# Patient Record
Sex: Male | Born: 1980 | Race: White | Hispanic: No | Marital: Married | Smoking: Former smoker
Health system: Southern US, Community
[De-identification: ages and names within clinical notes are randomized; demographics above are authoritative.]

## PROBLEM LIST (undated history)

## (undated) DIAGNOSIS — F1910 Other psychoactive substance abuse, uncomplicated: Secondary | ICD-10-CM

## (undated) DIAGNOSIS — M549 Dorsalgia, unspecified: Secondary | ICD-10-CM

## (undated) HISTORY — DX: Dorsalgia, unspecified: M54.9

## (undated) HISTORY — PX: TONSILECTOMY, ADENOIDECTOMY, BILATERAL MYRINGOTOMY AND TUBES: SHX2538

## (undated) HISTORY — PX: TONSILLECTOMY: SUR1361

---
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
## 2009-04-27 ENCOUNTER — Ambulatory Visit: Payer: Self-pay | Admitting: Interventional Radiology

## 2009-04-27 ENCOUNTER — Emergency Department (HOSPITAL_BASED_OUTPATIENT_CLINIC_OR_DEPARTMENT_OTHER): Admission: EM | Admit: 2009-04-27 | Discharge: 2009-04-27 | Payer: Self-pay | Admitting: Emergency Medicine

## 2016-03-24 ENCOUNTER — Emergency Department (HOSPITAL_BASED_OUTPATIENT_CLINIC_OR_DEPARTMENT_OTHER)
Admission: EM | Admit: 2016-03-24 | Discharge: 2016-03-25 | Disposition: A | Discharge status: Home / Self Care | Payer: Self-pay | Attending: Emergency Medicine | Admitting: Emergency Medicine

## 2016-03-24 ENCOUNTER — Emergency Department (HOSPITAL_BASED_OUTPATIENT_CLINIC_OR_DEPARTMENT_OTHER): Payer: Self-pay

## 2016-03-24 ENCOUNTER — Encounter (HOSPITAL_BASED_OUTPATIENT_CLINIC_OR_DEPARTMENT_OTHER): Payer: Self-pay | Admitting: *Deleted

## 2016-03-24 DIAGNOSIS — M549 Dorsalgia, unspecified: Secondary | ICD-10-CM | POA: Insufficient documentation

## 2016-03-24 DIAGNOSIS — W1809XA Striking against other object with subsequent fall, initial encounter: Secondary | ICD-10-CM | POA: Insufficient documentation

## 2016-03-24 DIAGNOSIS — R51 Headache: Secondary | ICD-10-CM | POA: Insufficient documentation

## 2016-03-24 DIAGNOSIS — S0285XA Fracture of orbit, unspecified, initial encounter for closed fracture: Secondary | ICD-10-CM

## 2016-03-24 DIAGNOSIS — Y939 Activity, unspecified: Secondary | ICD-10-CM | POA: Insufficient documentation

## 2016-03-24 DIAGNOSIS — S01112A Laceration without foreign body of left eyelid and periocular area, initial encounter: Secondary | ICD-10-CM | POA: Insufficient documentation

## 2016-03-24 DIAGNOSIS — S0232XA Fracture of orbital floor, left side, initial encounter for closed fracture: Secondary | ICD-10-CM | POA: Insufficient documentation

## 2016-03-24 DIAGNOSIS — F17210 Nicotine dependence, cigarettes, uncomplicated: Secondary | ICD-10-CM | POA: Insufficient documentation

## 2016-03-24 DIAGNOSIS — S022XXA Fracture of nasal bones, initial encounter for closed fracture: Secondary | ICD-10-CM | POA: Insufficient documentation

## 2016-03-24 DIAGNOSIS — Y999 Unspecified external cause status: Secondary | ICD-10-CM | POA: Insufficient documentation

## 2016-03-24 DIAGNOSIS — M25562 Pain in left knee: Secondary | ICD-10-CM | POA: Insufficient documentation

## 2016-03-24 DIAGNOSIS — Y929 Unspecified place or not applicable: Secondary | ICD-10-CM | POA: Insufficient documentation

## 2016-03-24 DIAGNOSIS — W19XXXA Unspecified fall, initial encounter: Secondary | ICD-10-CM

## 2016-03-24 MED ORDER — HYDROCODONE-ACETAMINOPHEN 5-325 MG PO TABS
1.0000 | ORAL_TABLET | Freq: Once | ORAL | Status: AC
  Administered 2016-03-24: 1 via ORAL
  Filled 2016-03-24: qty 1

## 2016-03-24 MED ORDER — FLUORESCEIN SODIUM 1 MG OP STRP
1.0000 | ORAL_STRIP | Freq: Once | OPHTHALMIC | Status: DC
  Filled 2016-03-24: qty 1

## 2016-03-24 MED ORDER — TETRACAINE HCL 0.5 % OP SOLN
2.0000 [drp] | Freq: Once | OPHTHALMIC | Status: DC
  Filled 2016-03-24: qty 4

## 2016-03-24 NOTE — ED Triage Notes (Signed)
Pt c/o " fall" left eye injury with bruising noted , left knee injury x 1 day ago

## 2016-03-24 NOTE — ED Notes (Signed)
Patient transported to CT 

## 2016-03-24 NOTE — ED Provider Notes (Signed)
[facility]-EMERGENCY DEPT Provider Note   CSN: [id number] Arrival date & time: 03/24/16  1906   By signing my name below, I, John Hood, attest that this documentation has been prepared under the direction and in the presence of  Arvilla MeresAshley Meyer, PA-C. Electronically Signed: Clovis PuAvnee Hood, ED Scribe. 03/24/16. 9:37 PM.   History   Chief Complaint Chief Complaint  Patient presents with  . Fall    The history is provided by the patient. No language interpreter was used.   HPI Comments:  John Hood is a 35 y.o. male who presents to the Emergency Department complaining of "throbbing" L eye pain and L knee pain s/p a fall which occured ~ 11 PM x 1 day. Pt states he was intoxicated, fell, hit his eye on a door hinge and broke his fall on his L knee. He also notes he hit his head and back on various objects. Associated symptoms includes neck pain, thoracic back pain, headache, eye discharge, laceration to eyelid, photophobia, and "hazy,foggy" vision. He notes his pain is exacerbated with movement. Pt denies LOC and fevers. No alleviating factors noted. No loss of bowel or bladder function. No numbness. Pt is not followed by an optometrist. He denies any other symptoms or complaints at this time. Tetanus is UTD. Following imaging, patient says he went [facility] early in the morning; however, left before he was seen. No treatments tried PTA.   History reviewed. No pertinent past medical history.  There are no active problems to display for this patient.   Past Surgical History:  Procedure Laterality Date  . TONSILECTOMY, ADENOIDECTOMY, BILATERAL MYRINGOTOMY AND TUBES    . TONSILLECTOMY      Home Medications    Prior to Admission medications   Medication Sig Start Date End Date Taking? Authorizing Provider  cephALEXin (KEFLEX) 500 MG capsule Take 1 capsule (500 mg total) by mouth 2 (two) times daily. 03/25/16 04/01/16  Lona KettleAshley Laurel Meyer, PA-C  erythromycin ophthalmic ointment Place  a 1/2 inch ribbon of ointment into the lower eyelid four times daily for 5-7 days 03/25/16   Lona KettleAshley Laurel Meyer, PA-C  HYDROcodone-acetaminophen (NORCO/VICODIN) 5-325 MG tablet Take 1 tablet by mouth every 6 (six) hours as needed for severe pain. 03/25/16   Lona KettleAshley Laurel Meyer, PA-C    Family History History reviewed. No pertinent family history.  Social History Social History  Substance Use Topics  . Smoking status: Current Every Day Smoker    Packs/day: 0.50    Types: Cigarettes  . Smokeless tobacco: Not on file  . Alcohol use No     Allergies   Aspirin   Review of Systems Review of Systems  Constitutional: Negative for fever.  HENT: Negative for trouble swallowing.   Eyes: Positive for photophobia, pain, discharge and visual disturbance.  Respiratory: Negative for shortness of breath.   Cardiovascular: Negative for chest pain.  Gastrointestinal: Negative for anal bleeding and vomiting.  Musculoskeletal: Positive for arthralgias and back pain.  Skin: Positive for wound.  Allergic/Immunologic: Negative for immunocompromised state.  Neurological: Positive for headaches. Negative for syncope and numbness.     Physical Exam Updated Vital Signs BP 121/80 (BP Location: Left Arm)   Pulse 61   Temp 98.1 F (36.7 C) (Oral)   Resp 18   Ht 5\' 7"  (1.702 m)   Wt 150 lb (68 kg)   SpO2 100%   BMI 23.49 kg/m   Physical Exam  Constitutional: He appears well-developed and well-nourished. No distress.  HENT:  Head: Normocephalic.    Right Ear: Tympanic membrane, external ear and ear canal normal. No hemotympanum.  Left Ear: Tympanic membrane, external ear and ear canal normal. No hemotympanum.  Nose: Nose normal. No nasal septal hematoma.  Mouth/Throat: Uvula is midline, oropharynx is clear and moist and mucous membranes are normal. No trismus in the jaw. No oropharyngeal exudate. No tonsillar exudate.  No obvious deformity of scalp. TTP of left parietal region.   Eyes:  EOM are normal. Pupils are equal, round, and reactive to light. Right eye exhibits no discharge. Left eye exhibits no discharge. Left conjunctiva has a hemorrhage. No scleral icterus.  Slit lamp exam:      The left eye shows no corneal abrasion, no corneal flare, no hyphema, no hypopyon and no fluorescein uptake.  Visual acuity is 20/40 R, 20/200 L uncorrected. Left eye: Periorbital ecchymosis. Subconjunctival hemorrhage to temporal region.  No proptosis. Eye is soft. Eyelid laceration noted to lateral upper lid. Lids are soft. TTP of orbit. PERRL. IOP is 12. EOMs intact. No entrapment. On fluorescein stain - no corneal foreign bodies, abrasions, or ulcerations. No seidel sign. Anterior chamber negative for hyphema or hypopyon.   Neck: Normal range of motion and phonation normal. Neck supple. No neck rigidity. Normal range of motion present.  Cardiovascular: Normal rate, regular rhythm, normal heart sounds and intact distal pulses.   No murmur heard. Pulmonary/Chest: Effort normal and breath sounds normal. No stridor. No respiratory distress. He has no wheezes. He has no rales.  Abdominal: Soft. Bowel sounds are normal. He exhibits no distension. There is no tenderness. There is no rigidity, no rebound, no guarding and no CVA tenderness.  Musculoskeletal: Normal range of motion. He exhibits tenderness. He exhibits no deformity.       Left knee: Tenderness found.  Midline thoracic tenderness.  Left knee: No obvious swelling, discoloration, or warmth. Diffuse TTP. Limited ROM and strength assessment secondary to pain. Neurovascularly intact distal to knee.   Lymphadenopathy:    He has no cervical adenopathy.  Neurological: He is alert. He is not disoriented. Coordination normal. GCS eye subscore is 4. GCS verbal subscore is 5. GCS motor subscore is 6.  Mental Status:  Alert, thought content appropriate, able to give a coherent history. Speech fluent without evidence of aphasia. Able to follow 2 step  commands without difficulty.  Cranial Nerves:  II:  pupils equal, round, reactive to light III,IV, VI: ptosis not present, extra-ocular motions intact bilaterally  V,VII: smile symmetric, facial light touch sensation equal VIII: hearing grossly normal to voice  X: uvula elevates symmetrically  XI: bilateral shoulder shrug symmetric and strong XII: midline tongue extension without fassiculations Motor:  Normal tone. 5/5 in UE b/l, equal and strong grip strength. Limited strength assessment in LLE secondary to knee pain.  Sensory: light touch normal in all extremities. Cerebellar: normal finger-to-nose with bilateral upper extremities Gait: deferred secondary to knee pain CV: distal pulses palpable throughout    Skin: Skin is warm and dry. He is not diaphoretic.  Psychiatric: He has a normal mood and affect. His behavior is normal.  Nursing note and vitals reviewed.    ED Treatments / Results  DIAGNOSTIC STUDIES:  Oxygen Saturation is 100% on RA, normal by my interpretation.    COORDINATION OF CARE:  9:31 PM Discussed treatment plan with pt at bedside and pt agreed to plan.  Labs (all labs ordered are listed, but only abnormal results are displayed) Labs Reviewed - No data to display  EKG  EKG Interpretation None       Radiology No results found.  Procedures Procedures (including critical care time)  Medications Ordered in ED Medications  HYDROcodone-acetaminophen (NORCO/VICODIN) 5-325 MG per tablet 1 tablet (1 tablet Oral Given 03/24/16 2351)  HYDROcodone-acetaminophen (NORCO/VICODIN) 5-325 MG per tablet 1 tablet (1 tablet Oral Given 03/25/16 0003)  erythromycin ophthalmic ointment ( Left Eye Given 03/25/16 0020)  cephALEXin (KEFLEX) capsule 500 mg (500 mg Oral Given 03/25/16 0018)     Initial Impression / Assessment and Plan / ED Course  I have reviewed the triage vital signs and the nursing notes.  Pertinent labs & imaging results that were available  during my care of the patient were reviewed by me and considered in my medical decision making (see chart for details).  Clinical Course  Value Comment By Time  DG Knee Complete 4 Views Left No obvious fracture or dislocation. Lona KettleAshley Laurel Hood, [place] 10/29 2000   CT head and maxillofacial reviewed Lona Kettleshley Laurel Meyer, PA-C 10/29 2300  DG Thoracic Spine 2 View No obvious fracture or subluxation.  Lona Kettleshley Laurel Hood, [place]C 10/29 2300    Patient presents to ED with complaint of left eye pain and swelling, headache, thoracic back pain, and left knee pain s/p injury x 1 day. Patient is afebrile and non-toxic appearing in NAD. VSS.   1. Left eye pain/swelling and headache: Physical exam remarkable for L periorbital ecchymosis with laceration to lateral upper lid and temporal subconjunctival hemorrhage. PERRL. V/A 20/40 R, 20/200 L. No proptosis. Eyeball is soft. TTP. EOMs intact, no entrapment. No seidel sign. No evidence of corneal abrasion, foreign body, or ulceration. Normal IOP. No cells and flare on slit lamp. TTP of left parietal region, no obvious deformity appreciated. CT maxillofacial and head remarkable for left lamina papyracea fracture, left nasal bone fracture, and left orbital floor fracture; globes are intact without entrapment. No intracranial hemorrhage. Patient is s/p laceration >12 hours, therefore laceration not repaired. UTD on tetanus. Patient placed on ophthalmic ABX ointment and PO antibiotics to prevent infection. Strict instructions to follow up with ophthalmology and ENT for further management, contact information provided.   2. Left knee pain: No obvious deformity, swelling, discoloration, or warmth. Diffuse TTP. Limited ROM secondary to pain. Neurovascularly intact distally. X-ray negative for fracture or dislocation, small suprapatellar joint effusion. May be indicative of ligamentous/tendon injury. Discussed results and plan with patient. Patient placed in knee sleeve and  provided crutches. Conservative management discussed to include RICE and pain control. Follow up with ortho if sxs persist.  3. Thoracic back pain: Mild TTP of thoracic spine. No loss of bowel or bladder function. No fever. No h/o cancer. X-ray negative for fracture or subluxation. Suspect MSK. Discussed conservative management.   Discussed patient with Dr. Erma HeritageIsaacs, who also saw patient. Agrees with plan.   Patient to follow up with ophthalmology, ENT, and ortho for further management s/p fall. Rx vicodin for severe pain. Review of [place] controlled substance database shows no recent narcotic rx. Return precautions given. Patient voiced understanding and is agreeable.    Final Clinical Impressions(s) / ED Diagnoses   Final diagnoses:  Fall, initial encounter  Acute pain of left knee  Left eyelid laceration, initial encounter  Closed fracture of orbit, initial encounter [facility](HCC)  Closed fracture of nasal bone, initial encounter    New Prescriptions Discharge Medication List as of 03/25/2016 12:52 AM    START taking these medications   Details  cephALEXin (KEFLEX) 500 MG capsule  Take 1 capsule (500 mg total) by mouth 2 (two) times daily., Starting Mon 03/25/2016, Until Mon 04/01/2016, Print    erythromycin ophthalmic ointment Place a 1/2 inch ribbon of ointment into the lower eyelid four times daily for 5-7 days, Print    HYDROcodone-acetaminophen (NORCO/VICODIN) 5-325 MG tablet Take 1 tablet by mouth every 6 (six) hours as needed for severe pain., Starting Mon 03/25/2016, Print      I personally performed the services described in this documentation, which was scribed in my presence. The recorded information has been reviewed and is accurate.     Lona Kettle, PA-C 03/27/16 1925    Shaune Pollack, MD 03/27/16 [phone]

## 2016-03-25 MED ORDER — HYDROCODONE-ACETAMINOPHEN 5-325 MG PO TABS: 1.0000 | ORAL_TABLET | Freq: Four times a day (QID) | ORAL | 0 refills | Status: DC | PRN

## 2016-03-25 MED ORDER — ERYTHROMYCIN 5 MG/GM OP OINT
TOPICAL_OINTMENT | Freq: Once | OPHTHALMIC | Status: AC
  Administered 2016-03-25: via OPHTHALMIC
  Filled 2016-03-25: qty 3.5

## 2016-03-25 MED ORDER — CEPHALEXIN 500 MG PO CAPS: 500.0000 mg | ORAL_CAPSULE | Freq: Two times a day (BID) | ORAL | 0 refills | Status: AC

## 2016-03-25 MED ORDER — ERYTHROMYCIN 5 MG/GM OP OINT: TOPICAL_OINTMENT | OPHTHALMIC | 0 refills | Status: DC

## 2016-03-25 MED ORDER — CEPHALEXIN 250 MG PO CAPS
500.0000 mg | ORAL_CAPSULE | Freq: Once | ORAL | Status: AC
  Administered 2016-03-25: 500 mg via ORAL
  Filled 2016-03-25: qty 2

## 2016-03-25 MED ORDER — HYDROCODONE-ACETAMINOPHEN 5-325 MG PO TABS
1.0000 | ORAL_TABLET | Freq: Once | ORAL | Status: AC
  Administered 2016-03-25: 1 via ORAL
  Filled 2016-03-25: qty 1

## 2016-03-25 NOTE — ED Notes (Signed)
Patient is in good condition, discharge instructions reviewed, prescription medication reviewed, follow up care reviewed; patient verbalized understanding; patient is ambulatory, and left with friends.

## 2016-03-25 NOTE — Discharge Instructions (Signed)
Read the information below.  You have a nasal fracture and orbital floor fracture. Your eye is intact. Your pressures are normal.  You are being prescribed antibiotic ointment. Apply a thin strip in bottom of eye four times a day for 5-7 days. You are also being placed on oral antibiotics to prevent infection.  It is very important that you follow up with the eye doctor in the next 1-2 days for re-evaluation. I have provided the contact information for the eye doctor. Please call in the morning.  It is also important that you follow up with the ear, nose, and throat doctor regarding the facial fractures. I have provided the contact information above. Please call in the morning.  Your knee x-ray shows a small joint effusion at the top of your knee. No obvious fractures or dislocations. Ice and elevate for 20 minute increments for the next 2-3 days. I have provided the contact information for orthopedics if pain persists.  You can take motrin for mild to moderate pain.  I have prescribed vicodin for severe pain.  Use the prescribed medication as directed.  Please discuss all new medications with your pharmacist.   You may return to the Emergency Department at any time for worsening condition or any new symptoms that concern you.

## 2017-05-08 IMAGING — CR DG THORACIC SPINE 2V
3 series · 3 of 3 positions shown · non-contrast
Comparison: Thoracic spine radiographs performed earlier today at
[DATE] p.m.

CLINICAL DATA: Status post fall, with mid back pain. Initial
encounter.

EXAM:
THORACIC SPINE 2 VIEWS

[t t-spine a.p.]
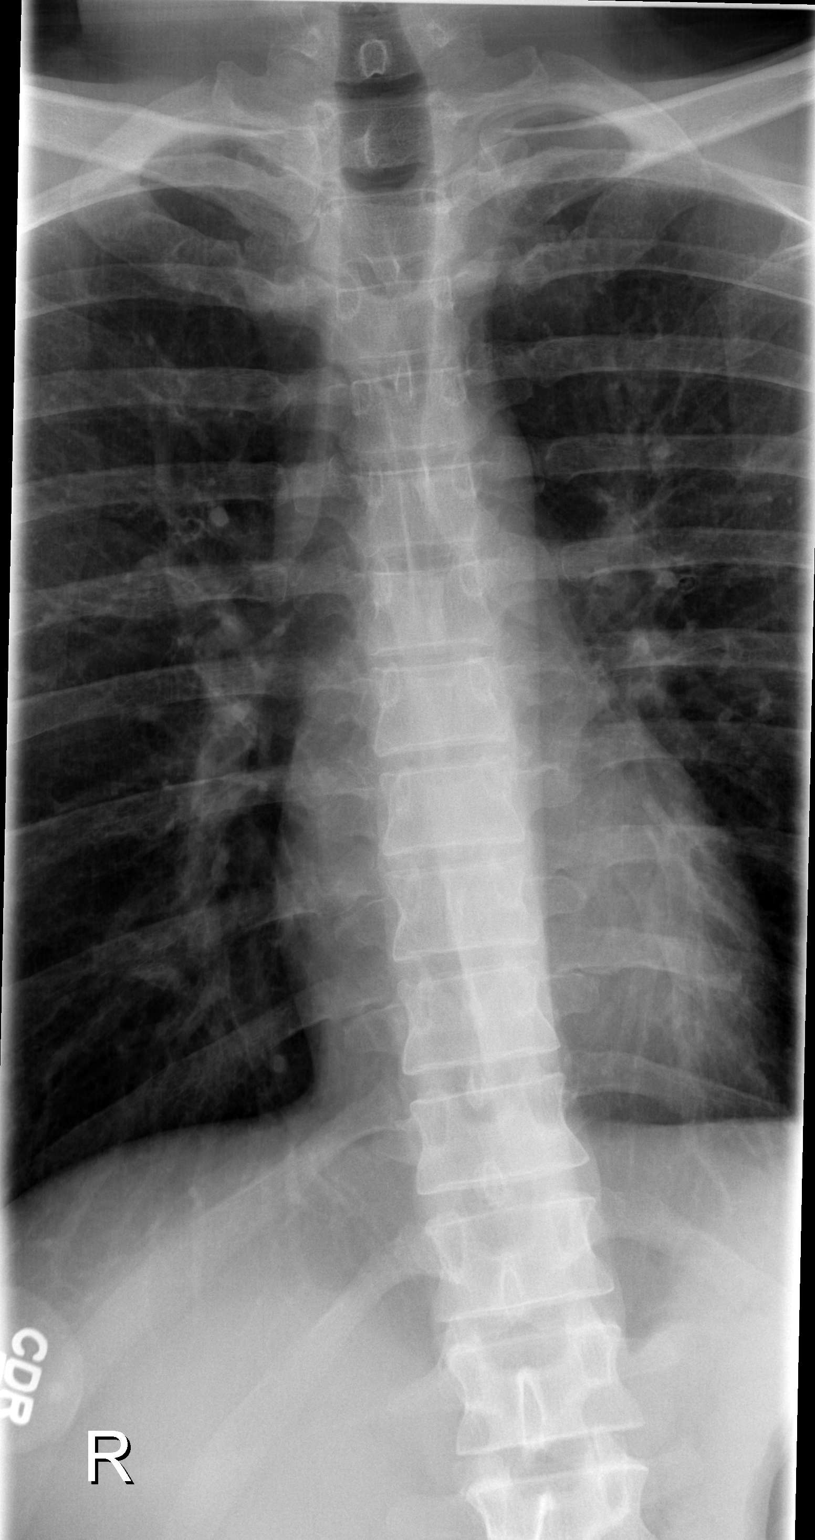

[t t-spine lat *]
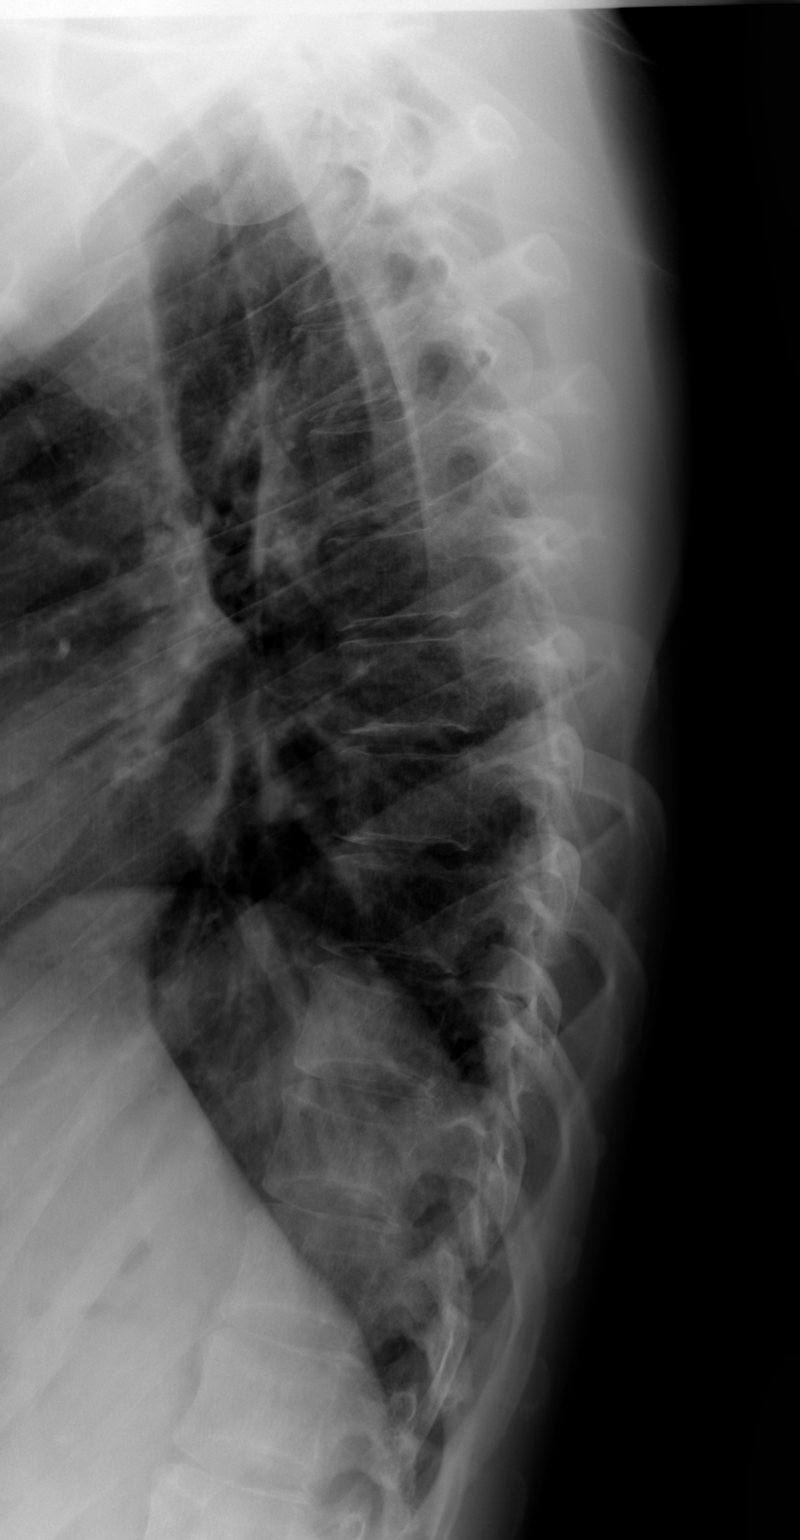

[t swimmers]
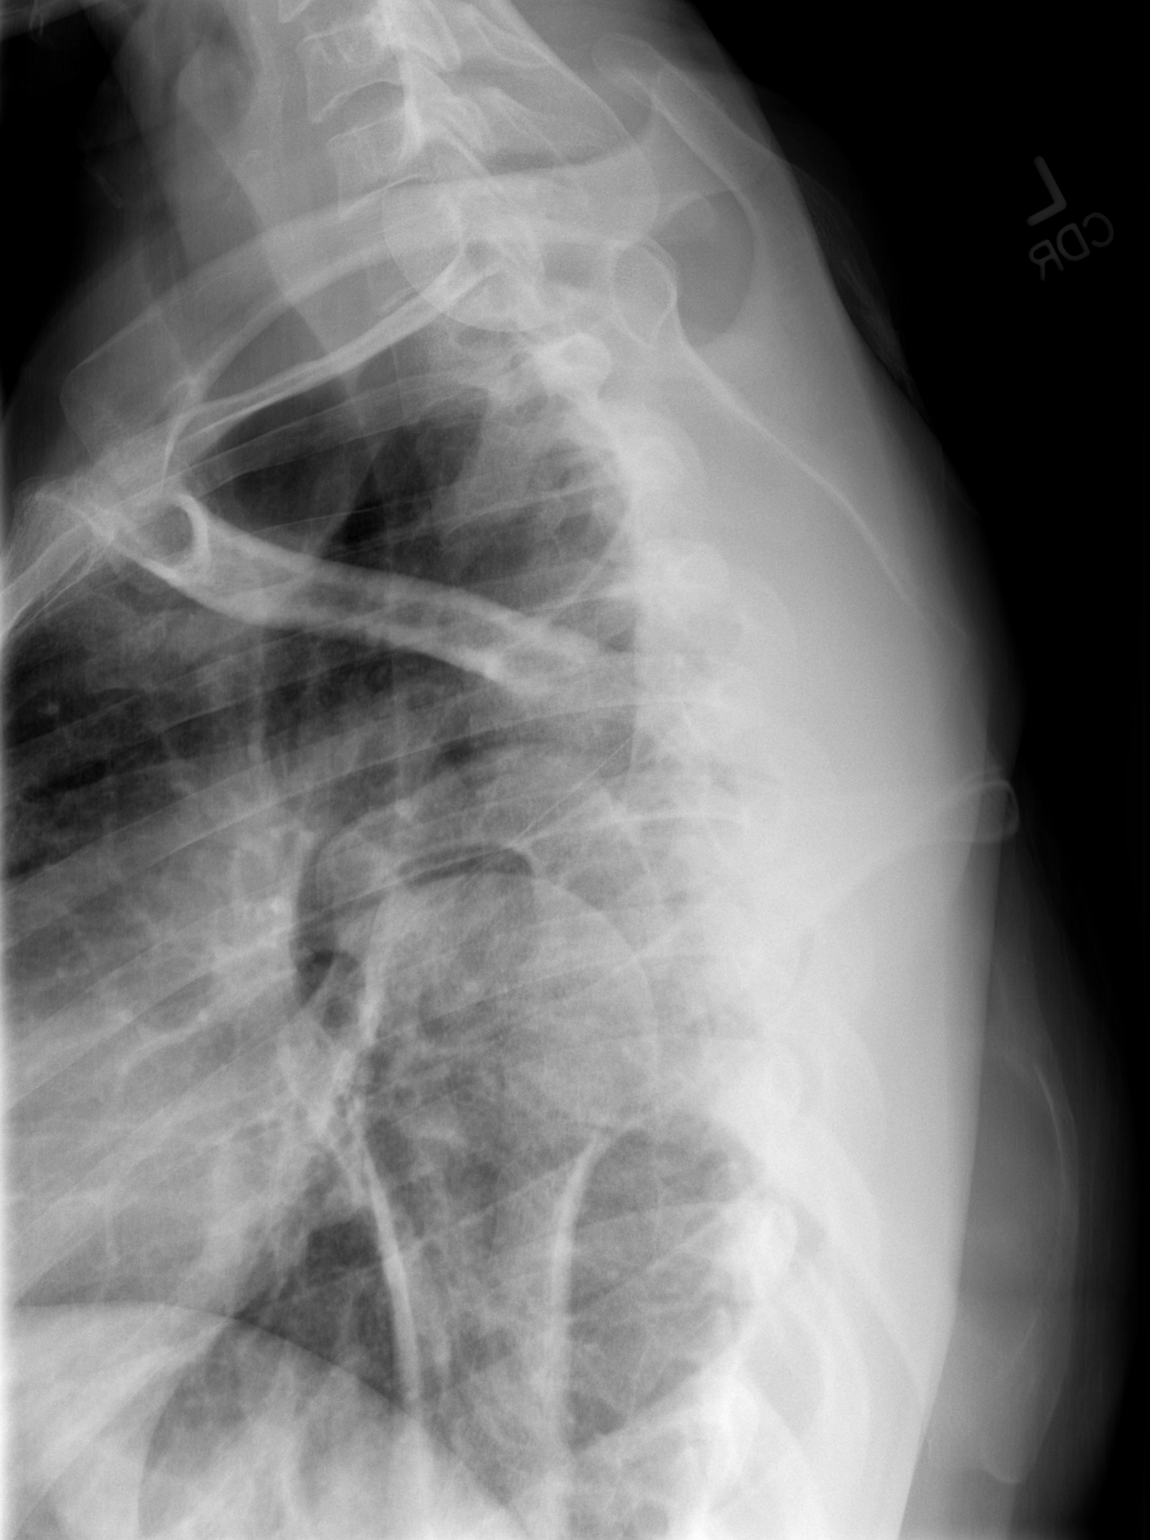

[3 of 3 positions shown; findings below may reference images not displayed]

FINDINGS: There is no evidence of fracture or subluxation. Vertebral bodies
demonstrate normal height and alignment. Intervertebral disc spaces
are preserved.

The visualized portions of both lungs are clear. The mediastinum is
unremarkable in appearance.
IMPRESSION: No evidence of fracture or subluxation along the thoracic spine.

## 2017-05-08 IMAGING — CT CT HEAD W/O CM
3 of 6 series · 16 of 47 positions shown, 19 images · non-contrast
Comparison: 03/24/2016 at [DATE]

CLINICAL DATA: Direct trauma to the left orbit after falling last
night

EXAM:
CT HEAD WITHOUT CONTRAST
CT MAXILLOFACIAL WITHOUT CONTRAST
TECHNIQUE: Multidetector CT imaging of the head and maxillofacial structures
were performed using the standard protocol without intravenous
contrast. Multiplanar CT image reconstructions of the maxillofacial
structures were also generated.

[Series 6: max soft · axial · 0.36mm/px · z∈[+1051,+1213]mm · 11 of 91 slices shown, 14 images]
[im 5/91  brain]
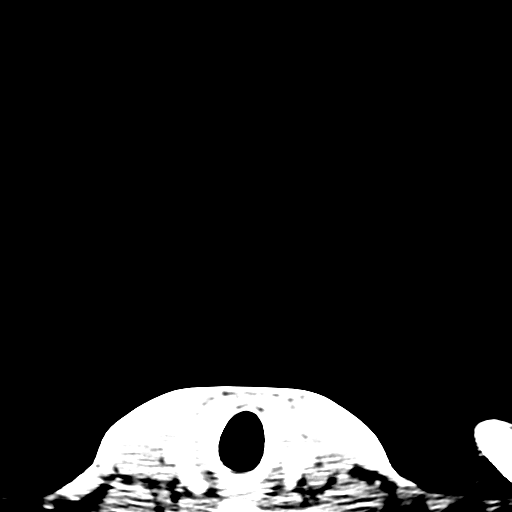
[im 5/91  bone]
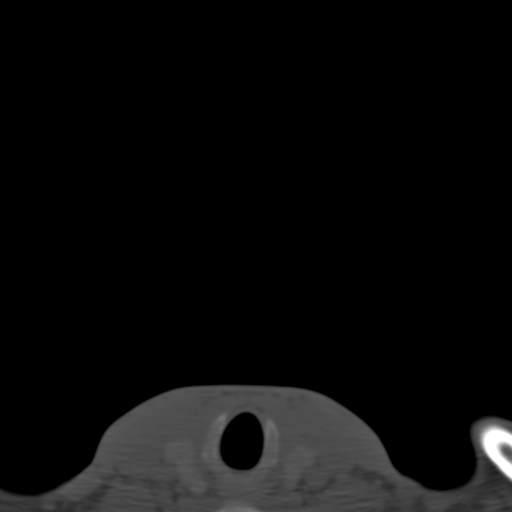
[im 13/91  brain]
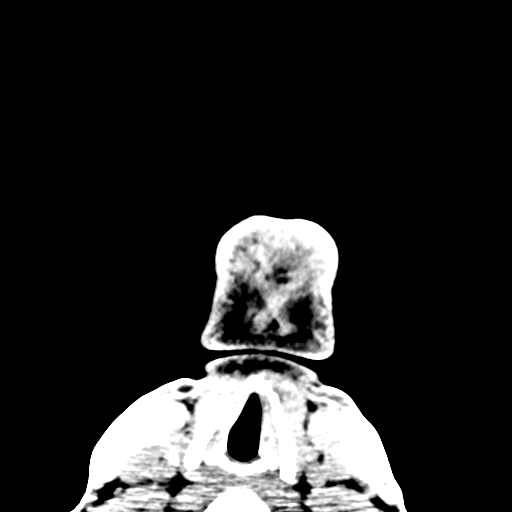
[im 21/91  brain]
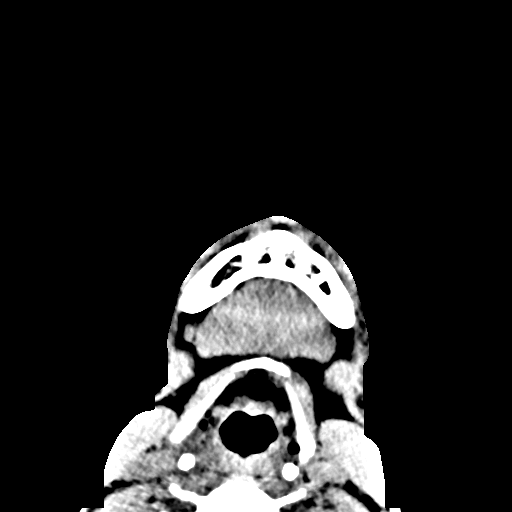
[im 29/91  brain]
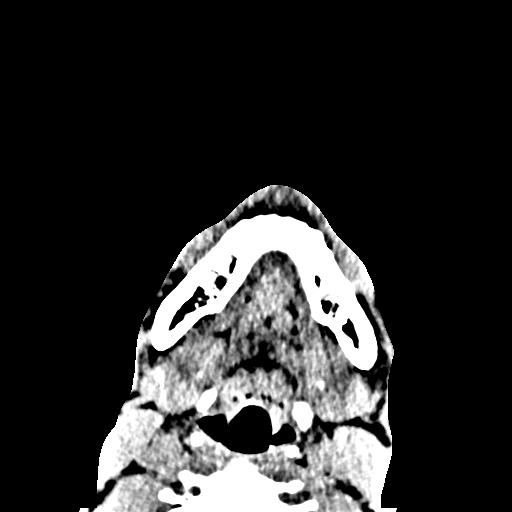
[im 37/91  brain]
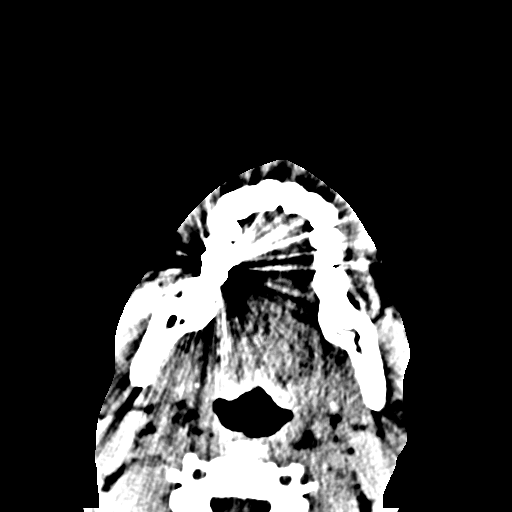
[im 37/91  bone]
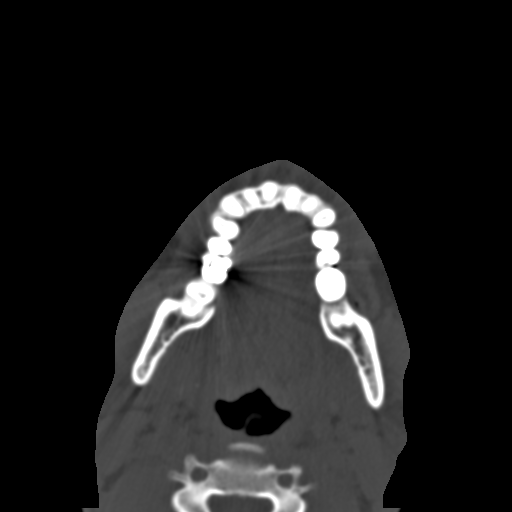
[im 46/91  brain]
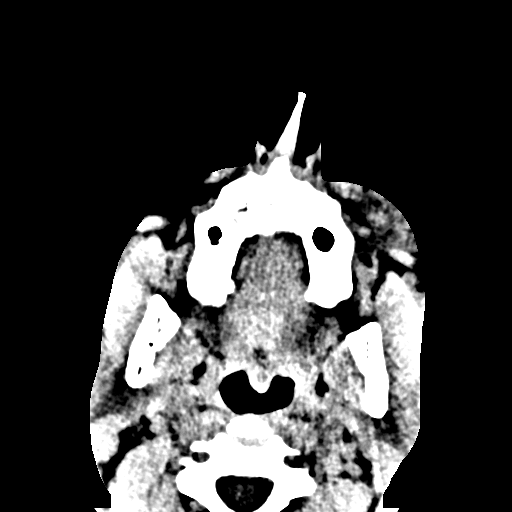
[im 54/91  brain]
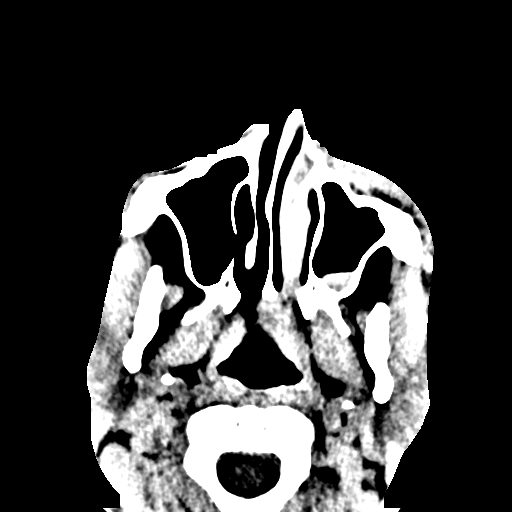
[im 62/91  brain]
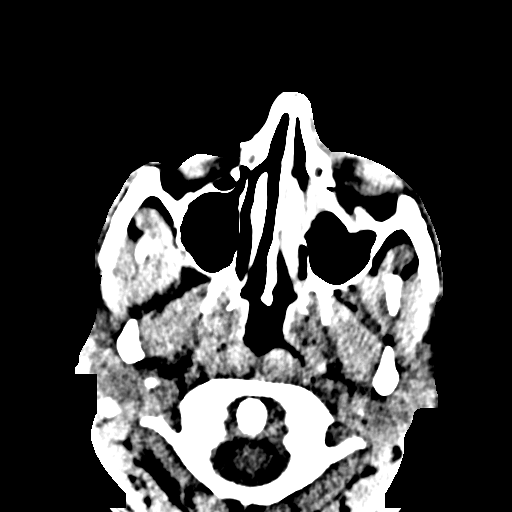
[im 70/91  brain]
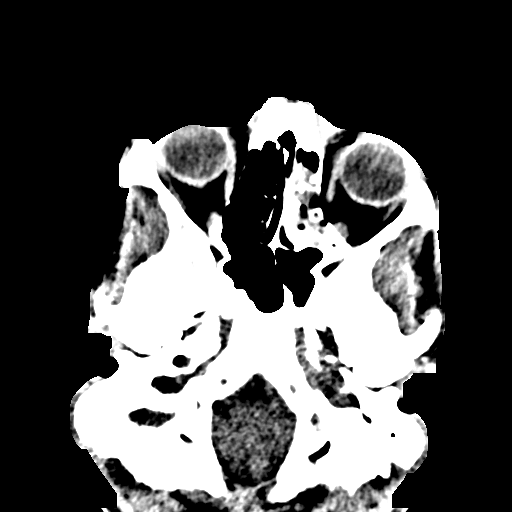
[im 70/91  bone]
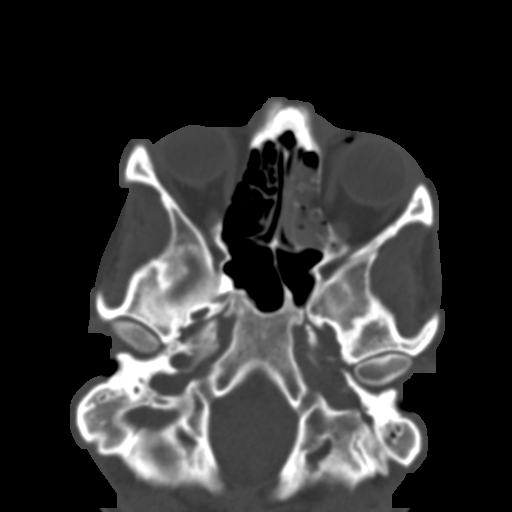
[im 78/91  brain]
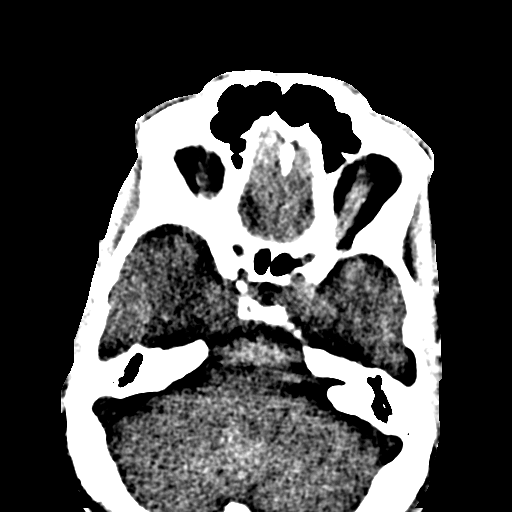
[im 86/91  brain]
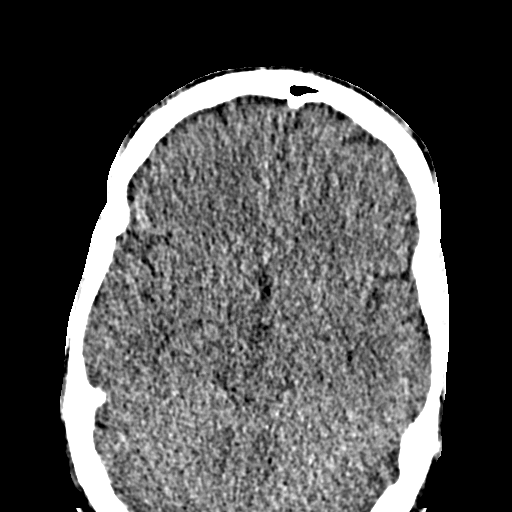

[Series 8: coronal soft · coronal · 0.33mm/px · 3 of 76 slices shown]
[im 22/76  brain]
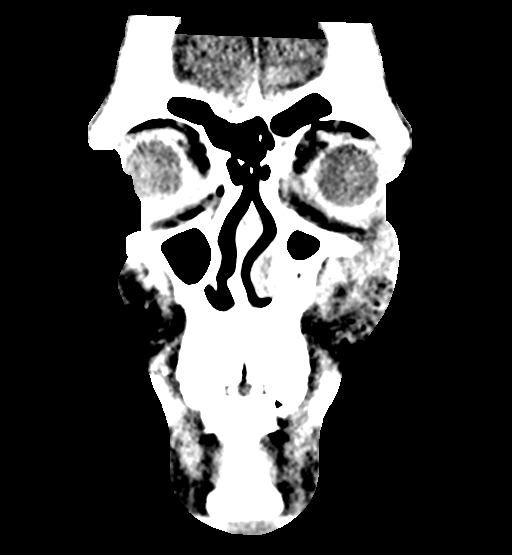
[im 40/76  brain]
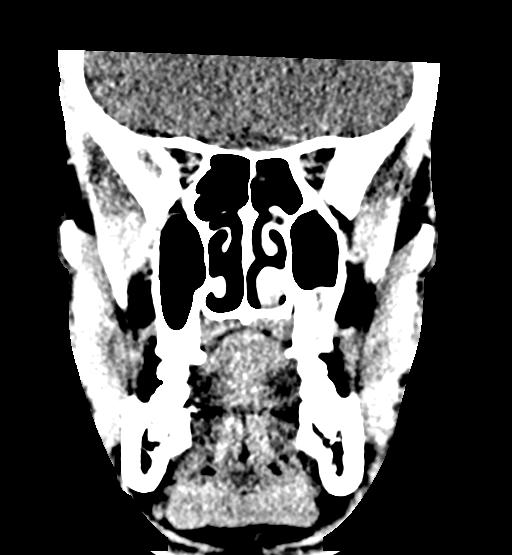
[im 58/76  brain]
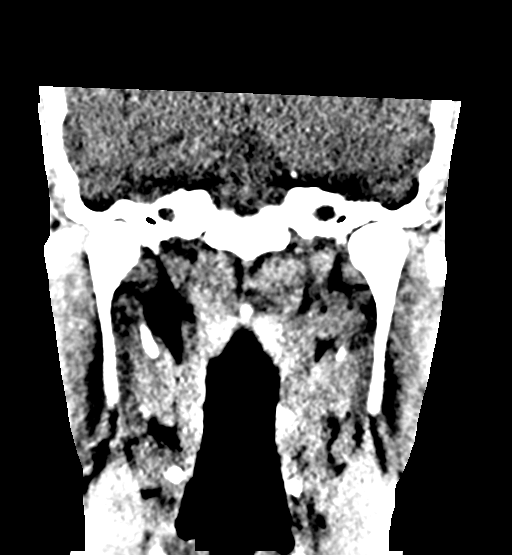

[Series 9: sagittal soft · sagittal · 0.33mm/px · 2 of 81 slices shown]
[im 27/81  brain]
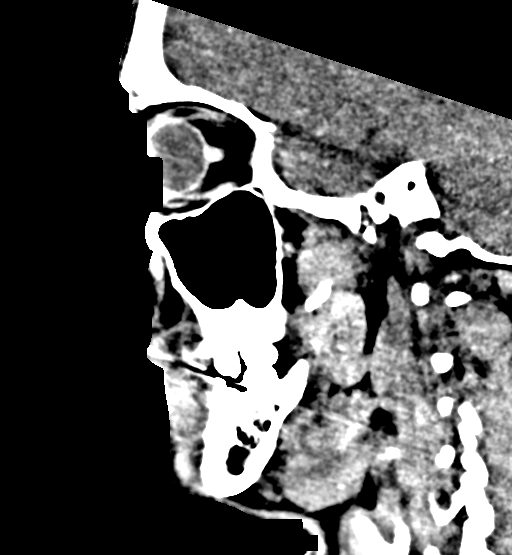
[im 54/81  brain]
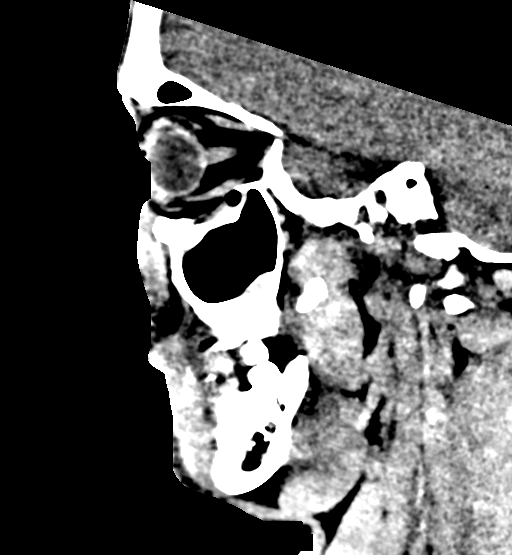

[16 of 47 positions shown; findings below may reference images not displayed]

FINDINGS: CT HEAD FINDINGS

Brain: No evidence of acute infarction, hemorrhage, hydrocephalus,
extra-axial collection or mass lesion/mass effect.

Vascular: No hyperdense vessel or unexpected calcification.

Skull: The calvarium and skullbase are intact.

CT MAXILLOFACIAL FINDINGS

Osseous: There is an acute depressed left lamina papyracea fracture,
unchanged. There is a mildly depressed left orbital floor fracture,
unchanged. There is a mildly depressed left nasal bone fracture,
unchanged.

Orbits: Mild left palpebral emphysema. Globes are intact. No
extraocular muscle entrapment.

Sinuses: Small air-fluid level in the left maxillary sinus.

Soft tissues: Periorbital swelling on the left.
IMPRESSION: 1. Normal brain
2. Acute mildly depressed fractures of the left lamina papyracea and
left orbital floor, unchanged. Acute mildly depressed left nasal
bone fracture.

## 2017-11-03 ENCOUNTER — Encounter (HOSPITAL_COMMUNITY): Payer: Self-pay

## 2017-11-03 ENCOUNTER — Emergency Department (HOSPITAL_COMMUNITY)
Admission: EM | Admit: 2017-11-03 | Discharge: 2017-11-03 | Disposition: A | Discharge status: Home / Self Care | Payer: Self-pay | Attending: Emergency Medicine | Admitting: Emergency Medicine

## 2017-11-03 ENCOUNTER — Other Ambulatory Visit: Payer: Self-pay

## 2017-11-03 DIAGNOSIS — F1910 Other psychoactive substance abuse, uncomplicated: Secondary | ICD-10-CM | POA: Insufficient documentation

## 2017-11-03 DIAGNOSIS — Z5321 Procedure and treatment not carried out due to patient leaving prior to being seen by health care provider: Secondary | ICD-10-CM | POA: Insufficient documentation

## 2017-11-03 HISTORY — DX: Other psychoactive substance abuse, uncomplicated: F19.10

## 2017-11-03 NOTE — ED Notes (Signed)
Pt decided to leave without being seen by provider. Pt states he was upset we were not going to admit him. Pt reports "there's no point in being seen if I'm not going to be staying overnight."

## 2017-11-03 NOTE — ED Notes (Signed)
Bed: [facility] Expected date:  Expected time:  Means of arrival:  Comments: 

## 2017-11-03 NOTE — ED Notes (Signed)
Bed: [facility] Expected date:  Expected time:  Means of arrival:  Comments: 

## 2017-11-03 NOTE — ED Triage Notes (Signed)
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 Patient states he last used heroin and xanax at 0300 today. Patient states he last drank alcohol yesterrday.Patient denies SI/HI.

## 2021-04-27 ENCOUNTER — Other Ambulatory Visit (HOSPITAL_BASED_OUTPATIENT_CLINIC_OR_DEPARTMENT_OTHER): Payer: Self-pay

## 2021-04-27 MED ORDER — GABAPENTIN 300 MG PO CAPS
ORAL_CAPSULE | ORAL | 0 refills | Status: DC
  Filled 2021-04-27: qty 21, 8d supply, fill #0

## 2021-04-27 MED ORDER — BUPRENORPHINE HCL-NALOXONE HCL 2-0.5 MG SL FILM
ORAL_FILM | SUBLINGUAL | 0 refills | Status: DC
  Filled 2021-04-27: qty 37, 7d supply, fill #0

## 2021-04-27 MED ORDER — BUPRENORPHINE HCL-NALOXONE HCL 8-2 MG SL FILM
ORAL_FILM | SUBLINGUAL | 0 refills | Status: DC
  Filled 2021-04-27: qty 14, 7d supply, fill #0

## 2021-04-30 ENCOUNTER — Other Ambulatory Visit (HOSPITAL_BASED_OUTPATIENT_CLINIC_OR_DEPARTMENT_OTHER): Payer: Self-pay

## 2021-05-04 ENCOUNTER — Other Ambulatory Visit (HOSPITAL_BASED_OUTPATIENT_CLINIC_OR_DEPARTMENT_OTHER): Payer: Self-pay

## 2021-05-04 MED ORDER — GABAPENTIN 300 MG PO CAPS
ORAL_CAPSULE | ORAL | 0 refills | Status: DC
  Filled 2021-05-04: qty 28, 7d supply, fill #0

## 2021-05-04 MED ORDER — BUPRENORPHINE HCL-NALOXONE HCL 8-2 MG SL FILM
ORAL_FILM | SUBLINGUAL | 0 refills | Status: DC
  Filled 2021-05-04: qty 14, 7d supply, fill #0

## 2021-05-11 ENCOUNTER — Other Ambulatory Visit (HOSPITAL_BASED_OUTPATIENT_CLINIC_OR_DEPARTMENT_OTHER): Payer: Self-pay

## 2021-05-11 MED ORDER — BUPRENORPHINE HCL-NALOXONE HCL 8-2 MG SL FILM
ORAL_FILM | SUBLINGUAL | 0 refills | Status: DC
  Filled 2021-05-11: qty 21, 7d supply, fill #0

## 2021-05-11 MED ORDER — GABAPENTIN 300 MG PO CAPS
ORAL_CAPSULE | ORAL | 0 refills | Status: DC
  Filled 2021-05-11: qty 28, 7d supply, fill #0

## 2021-05-14 ENCOUNTER — Other Ambulatory Visit (HOSPITAL_BASED_OUTPATIENT_CLINIC_OR_DEPARTMENT_OTHER): Payer: Self-pay

## 2021-05-18 ENCOUNTER — Other Ambulatory Visit (HOSPITAL_BASED_OUTPATIENT_CLINIC_OR_DEPARTMENT_OTHER): Payer: Self-pay

## 2021-05-18 MED ORDER — GABAPENTIN 600 MG PO TABS
ORAL_TABLET | ORAL | 0 refills | Status: DC
  Filled 2021-05-18: qty 42, 14d supply, fill #0

## 2021-05-18 MED ORDER — BUPRENORPHINE HCL-NALOXONE HCL 8-2 MG SL FILM
ORAL_FILM | SUBLINGUAL | 0 refills | Status: DC
  Filled 2021-05-18: qty 42, 14d supply, fill #0

## 2021-05-23 ENCOUNTER — Other Ambulatory Visit (HOSPITAL_COMMUNITY): Payer: Self-pay

## 2021-05-23 ENCOUNTER — Telehealth: Payer: Self-pay

## 2021-05-23 NOTE — Telephone Encounter (Signed)
[facility] Patient Advocate Encounter ? ?Insurance verification completed.   ? ?The patient is uninsured and will need patient assistance for medication. ? ?We can complete the application and will need to meet with the patient for signatures and income documentation. ? ?Ciaran Begay, CPhT ?Specialty Pharmacy Patient Advocate ?[facility] ?Phone: [phone] ?Fax:  [phone]  ?

## 2021-05-24 ENCOUNTER — Encounter: Payer: Self-pay | Admitting: Infectious Diseases

## 2021-06-01 ENCOUNTER — Encounter: Payer: Self-pay | Admitting: Infectious Diseases

## 2021-07-09 ENCOUNTER — Other Ambulatory Visit (HOSPITAL_BASED_OUTPATIENT_CLINIC_OR_DEPARTMENT_OTHER): Payer: Self-pay

## 2021-07-09 MED ORDER — BUPRENORPHINE HCL-NALOXONE HCL 8-2 MG SL FILM
ORAL_FILM | SUBLINGUAL | 0 refills | Status: DC
  Filled 2021-07-09: qty 21, 7d supply, fill #0

## 2022-09-25 ENCOUNTER — Encounter (HOSPITAL_BASED_OUTPATIENT_CLINIC_OR_DEPARTMENT_OTHER): Payer: Self-pay

## 2022-09-25 ENCOUNTER — Other Ambulatory Visit: Payer: Self-pay

## 2022-09-25 ENCOUNTER — Emergency Department (HOSPITAL_BASED_OUTPATIENT_CLINIC_OR_DEPARTMENT_OTHER)
Admission: EM | Admit: 2022-09-25 | Discharge: 2022-09-25 | Disposition: A | Discharge status: Home / Self Care | Payer: Self-pay | Attending: Emergency Medicine | Admitting: Emergency Medicine

## 2022-09-25 DIAGNOSIS — K029 Dental caries, unspecified: Secondary | ICD-10-CM | POA: Insufficient documentation

## 2022-09-25 MED ORDER — AMOXICILLIN-POT CLAVULANATE 875-125 MG PO TABS: 1.0000 | ORAL_TABLET | Freq: Two times a day (BID) | ORAL | 0 refills | Status: DC

## 2022-09-25 NOTE — ED Provider Notes (Signed)
EMERGENCY DEPARTMENT AT [facility] Provider Note   CSN: [id number] Arrival date & time: 09/25/22  1056     History  Chief Complaint  Patient presents with   Dental Pain    John Hood is a 42 y.o. male who presents emergency department concerns for right upper dental pain.  Notes he has a tooth broken to the area.  Patient is currently in [facility].  No meds tried prior to arrival.  Patient does not currently have a dentist.  Denies fever, trouble breathing, trouble swallowing, drainage.  The history is provided by the patient. No language interpreter was used.       Home Medications Prior to Admission medications   Medication Sig Start Date End Date Taking? Authorizing Provider  amoxicillin-clavulanate (AUGMENTIN) 875-125 MG tablet Take 1 tablet by mouth every 12 (twelve) hours. 09/25/22  Yes Zavian Slowey A, PA-C  Buprenorphine HCl-Naloxone HCl 2-0.5 MG FILM See patient instructions 04/27/21   Kern Alberta, MD  Buprenorphine HCl-Naloxone HCl 8-2 MG FILM Dissolve 1 tablet by mouth 3 times a day 07/09/21     erythromycin ophthalmic ointment Place a 1/2 inch ribbon of ointment into the lower eyelid four times daily for 5-7 days 03/25/16   Deborha Payment, PA-C  gabapentin (NEURONTIN) 300 MG capsule Take 2 capsules by mouth in the morning, 1 capsule in the evening, and 1 capsule at bedtime 05/11/21   Kern Alberta, MD  gabapentin (NEURONTIN) 600 MG tablet Take 1 tablet by mouth 3 times daily 05/18/21   Kern Alberta, MD  HYDROcodone-acetaminophen (NORCO/VICODIN) 5-325 MG tablet Take 1 tablet by mouth every 6 (six) hours as needed for severe pain. 03/25/16   Deborha Payment, PA-C      Allergies    Aspirin    Review of Systems   Review of Systems  All other systems reviewed and are negative.   Physical Exam Updated Vital Signs BP 115/75 (BP Location: Left Arm)   Pulse (!) 59   Temp 98.4 F (36.9 C) (Oral)   Resp 18   Ht 5\' 7"  (1.702 m)   Wt 74.8  kg   SpO2 96%   BMI 25.84 kg/m  Physical Exam Vitals and nursing note reviewed.  Constitutional:      General: He is not in acute distress.    Appearance: He is not ill-appearing.  HENT:     Head: Normocephalic and atraumatic.     Right Ear: External ear normal.     Left Ear: External ear normal.     Nose: Nose normal.     Mouth/Throat:     Mouth: Mucous membranes are moist. Mucous membranes are not dry.     Dentition: Abnormal dentition. Dental tenderness and dental caries present.     Tongue: Tongue does not deviate from midline.     Pharynx: Oropharynx is clear. Uvula midline. No oropharyngeal exudate or posterior oropharyngeal erythema.     Tonsils: No tonsillar exudate or tonsillar abscesses.     Comments: Multiple dental caries noted throughout. Tenderness to palpation to right upper gum line. No fluctuance noted. No trismus. No retropharyngeal abscess. No peritonsillar abscess noted. Uvula midline. Eyes:     Extraocular Movements: Extraocular movements intact.  Cardiovascular:     Rate and Rhythm: Normal rate and regular rhythm.     Pulses: Normal pulses.     Heart sounds: Normal heart sounds.  Pulmonary:     Effort: Pulmonary effort is normal. No respiratory distress.  Breath sounds: Normal breath sounds.  Abdominal:     General: Bowel sounds are normal. There is no distension.     Palpations: Abdomen is soft. There is no mass.     Tenderness: There is no abdominal tenderness.  Musculoskeletal:        General: Normal range of motion.     Cervical back: Neck supple.  Lymphadenopathy:     Head:     Right side of head: No submental, submandibular, tonsillar, preauricular or posterior auricular adenopathy.     Left side of head: No submental, submandibular, tonsillar, preauricular or posterior auricular adenopathy.     Cervical: No cervical adenopathy.  Skin:    General: Skin is warm and dry.     Findings: No rash.  Neurological:     Mental Status: He is alert.   Psychiatric:        Behavior: Behavior normal.     ED Results / Procedures / Treatments   Labs (all labs ordered are listed, but only abnormal results are displayed) Labs Reviewed - No data to display  EKG None  Radiology No results found.  Procedures Procedures    Medications Ordered in ED Medications - No data to display  ED Course/ Medical Decision Making/ A&P                             Medical Decision Making Risk Prescription drug management.   Patient presents to the ED with right upper dental pain onset several weeks.  Patient does not have a dentist.  Vital signs pt afebrile.  On exam patient with Multiple dental caries noted throughout. Tenderness to palpation to right upper gum line. No fluctuance noted. No trismus. No retropharyngeal abscess. No peritonsillar abscess noted. Uvula midline. Differential diagnosis includes pharyngeal abscess, dental abscess, Ludwig's angina.    Disposition: Patient presentation suspicious for dentalgia.  No abscess requiring immediate incision and drainage.  Exam not concerning for Ludwig's angina or pharyngeal abscess. After consideration of the diagnostic results and the patients response to treatment, I feel that the patient would benefit from Discharge home. Will treat with Augmentin prescription.  Provided resource guide for dentists in the area, patient structured to follow-up with a dentist. Supportive care measures and strict return precautions discussed with patient at bedside. Pt acknowledges and verbalizes understanding. Pt appears safe for discharge. Follow up as indicated in discharge paperwork.  This chart was dictated using voice recognition software, Dragon. Despite the best efforts of this provider to proofread and correct errors, errors may still occur which can change documentation meaning.   Final Clinical Impression(s) / ED Diagnoses Final diagnoses:  Pain due to dental caries    Rx / DC Orders ED  Discharge Orders          Ordered    amoxicillin-clavulanate (AUGMENTIN) 875-125 MG tablet  Every 12 hours        09/25/22 1136              Tyliah Schlereth A, PA-C 09/25/22 1453    Ernie Avena, MD 09/25/22 1946

## 2022-09-25 NOTE — Discharge Instructions (Addendum)
It was a pleasure taking care of you today!   You will be prescribed Augmentin, take as directed and ensure to complete the entire course of the antibiotic. You may take over the counter 600 mg Ibuprofen every 6 hours and alternate with 500 mg Tylenol every 6 hours as directed.  Attached is information for the on-call dentist, you may call and set up a follow-up appointment regarding today's ED visit.  Attached you will find a resource guide for dentists in the area, call and set up a follow up appointment. Return to the Emergency Department if you are experiencing increasing/worsening symptoms. 

## 2022-09-25 NOTE — ED Notes (Signed)
Pt treated in triage.

## 2022-09-25 NOTE — ED Triage Notes (Signed)
C/o right upper dental pain radiating to neck. States tooth is broken. Currently at [facility].

## 2022-10-14 ENCOUNTER — Ambulatory Visit: Payer: Self-pay | Admitting: Physician Assistant

## 2022-10-14 ENCOUNTER — Encounter: Payer: Self-pay | Admitting: Physician Assistant

## 2022-10-14 VITALS — BP 109/68 | HR 76 | Ht 66.0 in | Wt 174.0 lb

## 2022-10-14 DIAGNOSIS — F17210 Nicotine dependence, cigarettes, uncomplicated: Secondary | ICD-10-CM

## 2022-10-14 DIAGNOSIS — K0889 Other specified disorders of teeth and supporting structures: Secondary | ICD-10-CM

## 2022-10-14 DIAGNOSIS — F1110 Opioid abuse, uncomplicated: Secondary | ICD-10-CM

## 2022-10-14 DIAGNOSIS — Z720 Tobacco use: Secondary | ICD-10-CM

## 2022-10-14 DIAGNOSIS — M5441 Lumbago with sciatica, right side: Secondary | ICD-10-CM

## 2022-10-14 DIAGNOSIS — G8929 Other chronic pain: Secondary | ICD-10-CM | POA: Insufficient documentation

## 2022-10-14 DIAGNOSIS — F1210 Cannabis abuse, uncomplicated: Secondary | ICD-10-CM

## 2022-10-14 MED ORDER — IBUPROFEN 800 MG PO TABS
800.0000 mg | ORAL_TABLET | Freq: Three times a day (TID) | ORAL | 0 refills | Status: DC | PRN
Start: 2022-10-14 — End: 2023-06-16

## 2022-10-14 MED ORDER — GABAPENTIN 400 MG PO CAPS: 400.0000 mg | ORAL_CAPSULE | Freq: Three times a day (TID) | ORAL | 0 refills | Status: DC

## 2022-10-14 MED ORDER — PENICILLIN V POTASSIUM 500 MG PO TABS
500.0000 mg | ORAL_TABLET | Freq: Three times a day (TID) | ORAL | 0 refills | Status: AC
Start: 2022-10-14 — End: 2022-10-24

## 2022-10-14 NOTE — Patient Instructions (Signed)
To help with your dental pain, you are going to take penicillin 3 times a day for the next 10 days.  You will increase ibuprofen 800 mg every 8 hours as needed.  I strongly encourage you to follow-up with dentistry as soon as you are able.  For your chronic sciatica, I encourage you to stay well-hydrated, do gentle stretching.  You are going to increase gabapentin 400 mg 3 times a day.  We will call you with today's lab results, please let us know if there is anything else we can do for you  Roney Jaffe, PA-C Physician Assistant [facility] [email or web address]   Sciatica  Sciatica is pain, numbness, weakness, or tingling along the path of the sciatic nerve. The sciatic nerve starts in the lower back and runs down the back of each leg. The nerve controls the muscles in the lower leg and in the back of the knee. It also provides feeling (sensation) to the back of the thigh, the lower leg, and the sole of the foot. Sciatica is a symptom of another medical condition that pinches or puts pressure on the sciatic nerve. Sciatica most often only affects one side of the body. Sciatica usually goes away on its own or with treatment. In some cases, sciatica may come back (recur). What are the causes? This condition is caused by pressure on the sciatic nerve or pinching of the nerve. This may be the result of: A disk in between the bones of the spine bulging out too far (herniated disk). Age-related changes in the spinal disks. A pain disorder that affects a muscle in the buttock. Extra bone growth near the sciatic nerve. A break (fracture) of the pelvis. Pregnancy. Tumor. This is rare. What increases the risk? The following factors may make you more likely to develop this condition: Playing sports that place pressure or stress on the spine. Having poor strength and flexibility. A history of back injury or surgery. Sitting for long periods  of time. Doing activities that involve repetitive bending or lifting. Obesity. What are the signs or symptoms? Symptoms can vary from mild to very severe. They may include: Any of the following problems in the lower back, leg, hip, or buttock: Mild tingling, numbness, or dull aches. Burning sensations. Sharp pains. Numbness in the back of the calf or the sole of the foot. Leg weakness. Severe back pain that makes movement difficult. Symptoms may get worse when you cough, sneeze, or laugh, or when you sit or stand for long periods of time. How is this diagnosed? This condition may be diagnosed based on: Your symptoms and medical history. A physical exam. Blood tests. Imaging tests, such as: X-rays. An MRI. A CT scan. How is this treated? In many cases, this condition improves on its own without treatment. However, treatment may include: Reducing or modifying physical activity. Exercising, including strengthening and stretching. Icing and applying heat to the affected area. Medicines that help to: Relieve pain and swelling. Relax your muscles. Injections of medicines that help to relieve pain and inflammation (steroids) around the sciatic nerve. Surgery. Follow these instructions at home: Medicines Take over-the-counter and prescription medicines only as told by your health care provider. Ask your health care provider if the medicine prescribed to you requires you to avoid driving or using heavy machinery. Managing pain     If directed, put ice on the affected area. To do this: Put ice in a plastic bag. Place a towel between your skin and  the bag. Leave the ice on for 20 minutes, 2-3 times a day. If your skin turns bright red, remove the ice right away to prevent skin damage. The risk of skin damage is higher if you cannot feel pain, heat, or cold. If directed, apply heat to the affected area as often as told by your health care provider. Use the heat source that your  health care provider recommends, such as a moist heat pack or a heating pad. Place a towel between your skin and the heat source. Leave the heat on for 20-30 minutes. If your skin turns bright red, remove the heat right away to prevent burns. The risk of burns is higher if you cannot feel pain, heat, or cold. Activity  Return to your normal activities as told by your health care provider. Ask your health care provider what activities are safe for you. Avoid activities that make your symptoms worse. Take brief periods of rest throughout the day. When you rest for longer periods, mix in some mild activity or stretching between periods of rest. This will help to prevent stiffness and pain. Avoid sitting for long periods of time without moving. Get up and move around at least one time each hour. Exercise and stretch regularly as told by your health care provider. Do not lift anything that is heavier than 10 lb (4.5 kg) until your health care provider says that it is safe. When you do not have symptoms, you should still avoid heavy lifting, especially repetitive heavy lifting. When you lift objects, always use proper lifting technique, which includes: Bending your knees. Keeping the load close to your body. Avoiding twisting. General instructions Maintain a healthy weight. Excess weight puts extra stress on your back. Wear supportive, comfortable shoes. Avoid wearing high heels. Avoid sleeping on a mattress that is too soft or too hard. A mattress that is firm enough to support your back when you sleep may help to reduce your pain. Contact a health care provider if: Your pain is not controlled by medicine. Your pain does not improve or gets worse. Your pain lasts longer than 4 weeks. You have unexplained weight loss. Get help right away if: You are not able to control when you urinate or have bowel movements (incontinence). You have: Weakness in your lower back, pelvis, buttocks, or legs that  gets worse. Redness or swelling of your back. A burning sensation when you urinate. Summary Sciatica is pain, numbness, weakness, or tingling along the path of the sciatic nerve, which may include the lower back, legs, hips, and buttocks. This condition is caused by pressure on the sciatic nerve or pinching of the nerve. Treatment often includes rest, exercise, medicines, and applying ice or heat. This information is not intended to replace advice given to you by your health care provider. Make sure you discuss any questions you have with your health care provider. Document Revised: 08/20/2021 Document Reviewed: 08/20/2021 Elsevier Patient Education  2023 [employer].

## 2022-10-14 NOTE — Progress Notes (Signed)
New Patient Office Visit  Subjective    Patient ID: John Hood, male    DOB: 1980-07-28  Age: 42 y.o. MRN: [id number]  CC:  Chief Complaint  Patient presents with   Dental Pain    Left upper side.    Medication Refill    Gab- request 600 mg     HPI John Hood states that he is currently being treated for substance abuse at [facility] residential treatment center.  States that he will be leaving tomorrow to go to an [employer] in [place].  States that he has been sober for 14 months and is very much looking forward to "starting his life".  States that he has been suffering from dental pain, states that he was seen in the emergency department on Sep 25, 2022 for the same complaint.  Note from that visit:   John Hood is a 42 y.o. male who presents emergency department concerns for right upper dental pain.  Notes he has a tooth broken to the area.  Patient is currently in [facility].  No meds tried prior to arrival.  Patient does not currently have a dentist.  Denies fever, trouble breathing, trouble swallowing, drainage.   Patient presents to the ED with right upper dental pain onset several weeks.  Patient does not have a dentist.  Vital signs pt afebrile.  On exam patient with Multiple dental caries noted throughout. Tenderness to palpation to right upper gum line. No fluctuance noted. No trismus. No retropharyngeal abscess. No peritonsillar abscess noted. Uvula midline. Differential diagnosis includes pharyngeal abscess, dental abscess, Ludwig's angina.      Disposition: Patient presentation suspicious for dentalgia.  No abscess requiring immediate incision and drainage.  Exam not concerning for Ludwig's angina or pharyngeal abscess. After consideration of the diagnostic results and the patients response to treatment, I feel that the patient would benefit from Discharge home. Will treat with Augmentin prescription.  Provided resource guide for dentists in the area, patient structured  to follow-up with a dentist. Supportive care measures and strict return precautions discussed with patient at bedside. Pt acknowledges and verbalizes understanding. Pt appears safe for discharge. Follow up as indicated in discharge paperwork.  States today that he did have mild relief while taking the Augmentin 7-day course, states once it stopped it started hurting again.  States that he is using 600 mg of ibuprofen every 8 hours with mild relief.  States that his Medicaid recently became active and will be scheduling an appointment with dentistry soon.  States that he suffers from chronic right-sided sciatica.  States that he believes this is from a football injury.  States that he has been taking 300 mg of gabapentin 3 times a day, states that he does not feel it is offering as much relief as it did previously.  Outpatient Encounter Medications as of 10/14/2022  Medication Sig   ibuprofen (ADVIL) 800 MG tablet Take 1 tablet (800 mg total) by mouth every 8 (eight) hours as needed.   penicillin v potassium (VEETID) 500 MG tablet Take 1 tablet (500 mg total) by mouth 3 (three) times daily for 10 days.   gabapentin (NEURONTIN) 400 MG capsule Take 1 capsule (400 mg total) by mouth 3 (three) times daily.   [DISCONTINUED] amoxicillin-clavulanate (AUGMENTIN) 875-125 MG tablet Take 1 tablet by mouth every 12 (twelve) hours.   [DISCONTINUED] Buprenorphine HCl-Naloxone HCl 2-0.5 MG FILM See patient instructions   [DISCONTINUED] Buprenorphine HCl-Naloxone HCl 8-2 MG FILM Dissolve 1 tablet by mouth  3 times a day   [DISCONTINUED] erythromycin ophthalmic ointment Place a 1/2 inch ribbon of ointment into the lower eyelid four times daily for 5-7 days   [DISCONTINUED] gabapentin (NEURONTIN) 300 MG capsule Take 2 capsules by mouth in the morning, 1 capsule in the evening, and 1 capsule at bedtime   [DISCONTINUED] gabapentin (NEURONTIN) 600 MG tablet Take 1 tablet by mouth 3 times daily   [DISCONTINUED]  HYDROcodone-acetaminophen (NORCO/VICODIN) 5-325 MG tablet Take 1 tablet by mouth every 6 (six) hours as needed for severe pain.   No facility-administered encounter medications on file as of 10/14/2022.    Past Medical History:  Diagnosis Date   Substance abuse [facility])     Past Surgical History:  Procedure Laterality Date   TONSILECTOMY, ADENOIDECTOMY, BILATERAL MYRINGOTOMY AND TUBES     TONSILLECTOMY      Family History  Problem Relation Age of Onset   Cancer Mother     Social History   Socioeconomic History   Marital status: Married    Spouse name: Not on file   Number of children: Not on file   Years of education: Not on file   Highest education level: Not on file  Occupational History   Not on file  Tobacco Use   Smoking status: Former    Packs/day: .5    Types: Cigarettes   Smokeless tobacco: Never  Vaping Use   Vaping Use: Never used  Substance and Sexual Activity   Alcohol use: Not Currently    Comment: daily   Drug use: Not Currently    Comment: xanax and heroin   Sexual activity: Not on file  Other Topics Concern   Not on file  Social History Narrative   Not on file   Social Determinants of Health   Financial Resource Strain: Not on file  Food Insecurity: Not on file  Transportation Needs: Not on file  Physical Activity: Not on file  Stress: Not on file  Social Connections: Not on file  Intimate Partner Violence: Not on file    Review of Systems  Constitutional:  Negative for chills and fever.  HENT: Negative.    Eyes: Negative.   Respiratory:  Negative for shortness of breath.   Cardiovascular:  Negative for chest pain.  Gastrointestinal: Negative.   Genitourinary: Negative.   Musculoskeletal:  Positive for back pain and joint pain.  Skin: Negative.   Neurological: Negative.   Endo/Heme/Allergies: Negative.   Psychiatric/Behavioral: Negative.          Objective    BP 109/68 (BP Location: Left Arm, Patient Position: Sitting, Cuff  Size: Large)   Pulse 76   Ht 5\' 6"  (1.676 m)   Wt 174 lb (78.9 kg)   SpO2 99%   BMI 28.08 kg/m   Physical Exam Vitals reviewed.  Constitutional:      Appearance: Normal appearance.  HENT:     Head: Normocephalic and atraumatic.     Right Ear: External ear normal.     Left Ear: External ear normal.     Nose: Nose normal.     Mouth/Throat:     Mouth: Mucous membranes are moist.     Dentition: Abnormal dentition. Dental tenderness, gingival swelling, dental caries and dental abscesses present.     Pharynx: Oropharynx is clear.     Comments: Right upper  Eyes:     Extraocular Movements: Extraocular movements intact.     Conjunctiva/sclera: Conjunctivae normal.     Pupils: Pupils are equal, round, and reactive  to light.  Cardiovascular:     Rate and Rhythm: Normal rate and regular rhythm.     Pulses: Normal pulses.          Dorsalis pedis pulses are 2+ on the right side.       Posterior tibial pulses are 2+ on the right side and 2+ on the left side.     Heart sounds: Normal heart sounds.  Pulmonary:     Effort: Pulmonary effort is normal.     Breath sounds: Normal breath sounds.  Musculoskeletal:     Cervical back: Normal, normal range of motion and neck supple.     Thoracic back: Normal.     Lumbar back: Tenderness present. Decreased range of motion.     Comments: Lower right tenderness, pain elicited with ROM testing  Skin:    General: Skin is warm and dry.  Neurological:     General: No focal deficit present.     Mental Status: He is alert.  Psychiatric:        Mood and Affect: Mood normal.        Behavior: Behavior normal.        Thought Content: Thought content normal.        Judgment: Judgment normal.        Assessment & Plan:   Problem List Items Addressed This Visit       Nervous and Auditory   Chronic right-sided low back pain with right-sided sciatica   Relevant Medications   ibuprofen (ADVIL) 800 MG tablet   gabapentin (NEURONTIN) 400 MG capsule    Other Relevant Orders   Basic metabolic panel     Other   Marijuana abuse   Tobacco abuse   Opioid abuse (HCC)   Other Visit Diagnoses     Pain, dental    -  Primary   Relevant Medications   ibuprofen (ADVIL) 800 MG tablet   penicillin v potassium (VEETID) 500 MG tablet      1. Pain, dental Trial penicillin, ibuprofen.  Patient recently approved for Medicaid, encouraged to follow up with dentistry as soon as able.  Patient education given on supportive care, red flags given for prompt reevaluation. - ibuprofen (ADVIL) 800 MG tablet; Take 1 tablet (800 mg total) by mouth every 8 (eight) hours as needed.  Dispense: 60 tablet; Refill: 0 - penicillin v potassium (VEETID) 500 MG tablet; Take 1 tablet (500 mg total) by mouth 3 (three) times daily for 10 days.  Dispense: 30 tablet; Refill: 0  2. Chronic right-sided low back pain with right-sided sciatica Increase gabapentin 400 mg 3 times a day. - gabapentin (NEURONTIN) 400 MG capsule; Take 1 capsule (400 mg total) by mouth 3 (three) times daily.  Dispense: 90 capsule; Refill: 0 - Basic metabolic panel  3. Marijuana abuse In substance abuse treatment program  4. Opioid abuse (HCC)   5. Tobacco abuse    I have reviewed the patient's medical history (PMH, PSH, Social History, Family History, Medications, and allergies) , and have been updated if relevant. I spent 30 minutes reviewing chart and  face to face time with patient.    Return if symptoms worsen or fail to improve.   Kasandra Knudsen Mayers, PA-C

## 2022-10-15 LAB — BASIC METABOLIC PANEL
BUN/Creatinine Ratio: 11 (ref 9–20)
BUN: 11 mg/dL (ref 6–20)
CO2: 23 mmol/L (ref 20–29)
Calcium: 9.5 mg/dL (ref 8.7–10.2)
Chloride: 103 mmol/L (ref 96–106)
Creatinine, Ser: 1.02 mg/dL (ref 0.76–1.27)
Glucose: 112 mg/dL — ABNORMAL HIGH (ref 70–99)
Potassium: 4.4 mmol/L (ref 3.5–5.2)
Sodium: 141 mmol/L (ref 134–144)
eGFR: 96 mL/min/{1.73_m2} (ref >59)

## 2022-11-11 ENCOUNTER — Ambulatory Visit (INDEPENDENT_AMBULATORY_CARE_PROVIDER_SITE_OTHER): Payer: Medicaid Other | Admitting: Medical

## 2022-11-11 ENCOUNTER — Encounter: Payer: Self-pay | Admitting: Medical

## 2022-11-11 VITALS — BP 108/60 | HR 56 | Resp 18 | Ht 67.0 in | Wt 171.0 lb

## 2022-11-11 DIAGNOSIS — G629 Polyneuropathy, unspecified: Secondary | ICD-10-CM

## 2022-11-11 DIAGNOSIS — F1910 Other psychoactive substance abuse, uncomplicated: Secondary | ICD-10-CM | POA: Diagnosis not present

## 2022-11-11 DIAGNOSIS — M5440 Lumbago with sciatica, unspecified side: Secondary | ICD-10-CM

## 2022-11-11 DIAGNOSIS — G8929 Other chronic pain: Secondary | ICD-10-CM

## 2022-11-11 DIAGNOSIS — M79609 Pain in unspecified limb: Secondary | ICD-10-CM

## 2022-11-11 DIAGNOSIS — F17200 Nicotine dependence, unspecified, uncomplicated: Secondary | ICD-10-CM

## 2022-11-11 MED ORDER — GABAPENTIN 600 MG PO TABS: 600.0000 mg | ORAL_TABLET | Freq: Three times a day (TID) | ORAL | 0 refills | Status: DC

## 2022-11-11 MED ORDER — NICOTINE 21 MG/24HR TD PT24: 21.0000 mg | MEDICATED_PATCH | Freq: Every day | TRANSDERMAL | 0 refills | Status: DC

## 2022-11-11 NOTE — Patient Instructions (Signed)
Opioid Use Disorder: In remission for 1 year with Suboxone. Currently prescribed by [facility], a temporary organization for uninsured individuals. Discussed the need for a new provider to continue Suboxone prescription. -Refer to a clinic that can continue Suboxone prescription.  Chronic Back Pain with Right Sciatica: On Gabapentin 400mg  TID with partial relief. Patient reports increasing pain. -Increase Gabapentin to 600mg  TID. -Order lumbar spine x-ray.  Lower Extremity Pain and Swelling: Bilateral calf pain and swelling for 1 year. Varicose veins noted. -Order bilateral lower extremity ultrasound.  Tobacco Use Disorder: Current smoker, 1 pack per day for 15 years. Expressed interest in quitting. -Prescribe nicotine patches.  General Health Maintenance: -Schedule a wellness exam in 1 month with fasting labs (cholesterol, kidney function, infection finding cells).

## 2022-11-11 NOTE — Progress Notes (Signed)
Subjective:    Patient ID: John Hood, male    DOB: [birth year], 42 y.o.   MRN: [id number]  HPI   Pt consented to AI use.  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 The Suboxone has been effective in maintaining sobriety, and the Gabapentin was prescribed for sciatic nerve pain in the back, radiating down to the leg and buttock.  The patient also reports a 15-year history of smoking approximately one pack of cigarettes per day, and has expressed a desire to quit. Previous attempts to quit using nicotine gum and patches were somewhat successful, and the patient is open to trying these methods again.  In addition to the sciatic nerve pain, the patient has been experiencing pain in the back of both legs for approximately one year, which is associated with swelling in the calves and ankles after being on his feet all day. The patient has also noticed the appearance of varicose veins in his legs, which were not present before.  The patient maintains a moderate level of physical activity, walking approximately four miles four days a week, but admits to a diet high in fried foods and low in fruits and vegetables. The patient has no other known medical issues or surgeries, and has not had any recent changes in medication.          Past Medical History:  Diagnosis Date   Back pain    Substance abuse (HCC)      Social History   Socioeconomic History   Marital status: Married    Spouse name: Not on file   Number of children: Not on file   Years of education: Not on file   Highest education level: Not on file  Occupational History   Not on file  Tobacco Use   Smoking status: Former    Packs/day: 1.00    Years: 14.00    Additional pack years: 0.00    Total pack years: 14.00    Types: Cigarettes   Smokeless tobacco: Never  Vaping Use   Vaping Use: Never used  Substance and  Sexual Activity   Alcohol use: Not Currently    Comment: daily   Drug use: Not Currently    Comment: xanax and heroin   Sexual activity: Yes  Other Topics Concern   Not on file  Social History Narrative   Not on file   Social Determinants of Health   Financial Resource Strain: Not on file  Food Insecurity: Not on file  Transportation Needs: Not on file  Physical Activity: Not on file  Stress: Not on file  Social Connections: Not on file  Intimate Partner Violence: Not on file    Past Surgical History:  Procedure Laterality Date   TONSILECTOMY, ADENOIDECTOMY, BILATERAL MYRINGOTOMY AND TUBES     TONSILLECTOMY     TONSILLECTOMY      Family History  Problem Relation Age of Onset   Cancer Mother     Allergies  Allergen Reactions   Aspirin Hives    Current Outpatient Medications on File Prior to Visit  Medication Sig Dispense Refill   buprenorphine-naloxone (SUBOXONE) 8-2 mg SUBL SL tablet Place 1 tablet under the tongue 3 (three) times daily.     ibuprofen (ADVIL) 800 MG tablet Take 1 tablet (800 mg total) by mouth every 8 (eight) hours as needed. 60 tablet 0   No current facility-administered medications on file prior  to visit.    BP 108/60   Pulse (!) 56   Resp 18   Ht 5\' 7"  (1.702 m)   Wt 171 lb (77.6 kg)   SpO2 100%   BMI 26.78 kg/m      Review of Systems  Constitutional:  Negative for chills and fever.  Respiratory:  Negative for cough, chest tightness, wheezing and stridor.   Cardiovascular:  Negative for chest pain.  Gastrointestinal:  Negative for abdominal pain, nausea and vomiting.  Musculoskeletal:  Positive for back pain. Negative for myalgias and neck stiffness.       Popiteal pain.  Skin:  Negative for rash.  Psychiatric/Behavioral:  Negative for behavioral problems, decreased concentration and hallucinations.          Objective:   Physical Exam  General Appearance- Not in acute distress.  Skin- mild dilated tiny vessels / varicose  veins. left distal thigh. But not tender.  Chest and Lung Exam Auscultation: Breath sounds:-Normal. Clear even and unlabored. Adventitious sounds:- No Adventitious sounds.  Cardiovascular Auscultation:Rythm - Regular, rate and rythm. Heart Sounds -Normal heart sounds.  Abdomen Inspection:-Inspection Normal.  Palpation/Perucssion: Palpation and Percussion of the abdomen reveal- Non Tender, No Rebound tenderness, No rigidity(Guarding) and No Palpable abdominal masses.  Liver:-Normal.  Spleen:- Normal.   Back Mid lumbar spine tenderness to palpation. Pain on straight leg lift. Pain on lateral movements and flexion/extension of the spine.  Lower ext neurologic  L5-S1 sensation intact bilaterally. Normal patellar reflexes bilaterally. No foot drop bilaterally.   Lower ext- mild popliteal bulge and mild tenderness. Calves not swollen.    Assessment & Plan:   Opioid Use Disorder: In remission for 1 year with Suboxone. Currently prescribed by [facility], a temporary organization for uninsured individuals. Discussed the need for a new provider to continue Suboxone prescription. -Refer to a clinic that can continue Suboxone prescription.  Chronic Back Pain with Right Sciatica: On Gabapentin 400mg  TID with partial relief. Patient reports increasing pain. -Increase Gabapentin to 600mg  TID. -Order lumbar spine x-ray.  Lower Extremity Pain and Swelling: Bilateral calf pain and swelling for 1 year. Varicose veins noted. -Order bilateral lower extremity ultrasound.  Tobacco Use Disorder: Current smoker, 1 pack per day for 15 years. Expressed interest in quitting. -Prescribe nicotine patches.  General Health Maintenance: -Schedule a wellness exam in 1 month with fasting labs (cholesterol, kidney function, infection finding cells).   Esperanza Richters, PA-C

## 2022-11-11 NOTE — Addendum Note (Signed)
Addended by: Roxanne Gates on: 11/11/2022 01:58 PM   Modules accepted: Orders

## 2022-11-21 ENCOUNTER — Ambulatory Visit (HOSPITAL_BASED_OUTPATIENT_CLINIC_OR_DEPARTMENT_OTHER): Payer: Medicaid Other

## 2022-12-03 ENCOUNTER — Telehealth: Payer: Self-pay | Admitting: Medical

## 2022-12-03 MED ORDER — GABAPENTIN 600 MG PO TABS: 600.0000 mg | ORAL_TABLET | Freq: Three times a day (TID) | ORAL | 0 refills | Status: DC

## 2022-12-03 NOTE — Telephone Encounter (Signed)
Rx sent 

## 2022-12-03 NOTE — Addendum Note (Signed)
Addended by: Maximino Sarin on: 12/03/2022 01:51 PM   Modules accepted: Orders

## 2022-12-03 NOTE — Telephone Encounter (Signed)
Patient called and would like a med refill on  gabapentin (NEURONTIN) 600 MG tablet.

## 2022-12-04 ENCOUNTER — Encounter: Payer: Self-pay | Admitting: Medical

## 2022-12-04 ENCOUNTER — Telehealth: Payer: Self-pay | Admitting: *Deleted

## 2022-12-04 MED ORDER — GABAPENTIN 600 MG PO TABS: 600.0000 mg | ORAL_TABLET | Freq: Three times a day (TID) | ORAL | 0 refills | Status: DC

## 2022-12-04 NOTE — Telephone Encounter (Signed)
Can u resend pt meds

## 2022-12-04 NOTE — Telephone Encounter (Signed)
Rx faxed over to pharmacy.

## 2022-12-04 NOTE — Telephone Encounter (Signed)
Rx has been sent in. 

## 2022-12-04 NOTE — Telephone Encounter (Signed)
error 

## 2022-12-04 NOTE — Telephone Encounter (Signed)
Pt called to follow up stating pharmacy did not receive the script. After reviewing chart, advised that there was an error on our end and that a high priority message would be sent to get it rectified.

## 2022-12-04 NOTE — Telephone Encounter (Signed)
Script was faxed via fax machine with confirmation, will resend

## 2023-01-07 ENCOUNTER — Telehealth: Payer: Self-pay | Admitting: Medical

## 2023-01-07 MED ORDER — GABAPENTIN 600 MG PO TABS: 600.0000 mg | ORAL_TABLET | Freq: Three times a day (TID) | ORAL | 0 refills | Status: DC

## 2023-01-07 NOTE — Telephone Encounter (Signed)
Pt called back stating that he needed it sent in by 11a today (8.13.24) due to delivery constraints. Pt is currently out of medication. Routed HP due to time sensitivity.

## 2023-01-07 NOTE — Addendum Note (Signed)
Addended by: Maximino Sarin on: 01/07/2023 10:51 AM   Modules accepted: Orders

## 2023-01-07 NOTE — Addendum Note (Signed)
Addended by: Maximino Sarin on: 01/07/2023 10:48 AM   Modules accepted: Orders

## 2023-01-07 NOTE — Addendum Note (Signed)
Addended by: Maximino Sarin on: 01/07/2023 10:47 AM   Modules accepted: Orders

## 2023-01-07 NOTE — Telephone Encounter (Signed)
Rx faxed with confirmation page

## 2023-01-07 NOTE — Telephone Encounter (Signed)
Prescription Request  01/07/2023  Is this a "Controlled Substance" medicine? No  LOV: 11/11/2022  What is the name of the medication or equipment?  gabapentin (NEURONTIN) 600 MG table  Have you contacted your pharmacy to request a refill? No   Which pharmacy would you like this sent to?   [facility] - [place], [place] - [address] [place] [address] Phone: [phone] Fax: [phone]    Patient notified that their request is being sent to the clinical staff for review and that they should receive a response within 2 business days.   Please advise at [facility] [phone]

## 2023-02-03 ENCOUNTER — Telehealth: Payer: Self-pay | Admitting: Medical

## 2023-02-03 ENCOUNTER — Other Ambulatory Visit: Payer: Self-pay | Admitting: Medical

## 2023-02-03 ENCOUNTER — Other Ambulatory Visit: Payer: Self-pay

## 2023-02-03 ENCOUNTER — Telehealth: Payer: Self-pay

## 2023-02-03 MED ORDER — GABAPENTIN 600 MG PO TABS: 600.0000 mg | ORAL_TABLET | Freq: Three times a day (TID) | ORAL | 0 refills | Status: DC

## 2023-02-03 NOTE — Telephone Encounter (Signed)
Pt called back to follow up on refill status of Gabapentin. After reviewing chart, noted the meds were sent to the wrong pharmacy and advised pt a message would be sent back to get this corrected. Routed HP due to internal error.

## 2023-02-03 NOTE — Telephone Encounter (Signed)
Rx sent 

## 2023-02-03 NOTE — Telephone Encounter (Signed)
Called [facility] canceled Gabapentin

## 2023-02-03 NOTE — Telephone Encounter (Signed)
Refill request sent to provider and other Rx was canceled.

## 2023-02-03 NOTE — Addendum Note (Signed)
Addended by: Gwenevere Abbot on: 02/03/2023 09:31 PM   Modules accepted: Orders

## 2023-02-03 NOTE — Telephone Encounter (Signed)
Prescription Request  02/03/2023  Is this a "Controlled Substance" medicine? No  LOV: 11/11/2022  What is the name of the medication or equipment? gabapentin (NEURONTIN) 600 MG tablet  Have you contacted your pharmacy to request a refill? No   Which pharmacy would you like this sent to?   [facility] - [place], [place] - [address] [place] [address] Phone: [phone] Fax: [phone]     Patient notified that their request is being sent to the clinical staff for review and that they should receive a response within 2 business days.   Please advise at [facility] [phone]

## 2023-02-28 ENCOUNTER — Telehealth: Payer: Self-pay | Admitting: Medical

## 2023-02-28 MED ORDER — GABAPENTIN 600 MG PO TABS: 600.0000 mg | ORAL_TABLET | Freq: Three times a day (TID) | ORAL | 0 refills | Status: DC

## 2023-02-28 NOTE — Telephone Encounter (Signed)
Rx sent 

## 2023-02-28 NOTE — Telephone Encounter (Signed)
Prescription Request  02/28/2023  Is this a "Controlled Substance" medicine? No  LOV: 11/11/2022  What is the name of the medication or equipment? gabapentin (NEURONTIN) 600 MG tablet   Have you contacted your pharmacy to request a refill? No   Which pharmacy would you like this sent to?  [address] [place] [address] Phone: [phone] Fax: [phone]      Patient notified that their request is being sent to the clinical staff for review and that they should receive a response within 2 business days.   Please advise at [facility] [phone]

## 2023-02-28 NOTE — Addendum Note (Signed)
Addended by: Maximino Sarin on: 02/28/2023 11:57 AM   Modules accepted: Orders

## 2023-03-20 ENCOUNTER — Telehealth: Payer: Self-pay | Admitting: Medical

## 2023-03-20 NOTE — Telephone Encounter (Signed)
Pt just wanted to call in his rx ahead of time because he is switching his pharmacy.   gabapentin (NEURONTIN) 600 MG tablet  [facility] [address], [place], [place] [address]

## 2023-04-01 ENCOUNTER — Telehealth: Payer: Self-pay | Admitting: Medical

## 2023-04-01 MED ORDER — GABAPENTIN 600 MG PO TABS: 600.0000 mg | ORAL_TABLET | Freq: Three times a day (TID) | ORAL | 0 refills | Status: DC

## 2023-04-01 NOTE — Telephone Encounter (Signed)
Rx sent 

## 2023-04-01 NOTE — Addendum Note (Signed)
Addended by: Maximino Sarin on: 04/01/2023 10:38 AM   Modules accepted: Orders

## 2023-04-01 NOTE — Telephone Encounter (Signed)
Prescription Request  04/01/2023  Is this a "Controlled Substance" medicine? No  LOV: 11/11/2022  What is the name of the medication or equipment? gabapentin (NEURONTIN) 600 MG tablet  Have you contacted your pharmacy to request a refill? No   Which pharmacy would you like this sent to?   [facility] - [place], [place] - [address] [place] [address] Phone: [phone] Fax: [phone]   Patient notified that their request is being sent to the clinical staff for review and that they should receive a response within 2 business days.   Please advise at [facility] [phone]

## 2023-04-02 MED ORDER — GABAPENTIN 600 MG PO TABS: 600.0000 mg | ORAL_TABLET | Freq: Three times a day (TID) | ORAL | 0 refills | Status: DC

## 2023-04-02 NOTE — Addendum Note (Signed)
Addended by: Maximino Sarin on: 04/02/2023 08:45 AM   Modules accepted: Orders

## 2023-04-02 NOTE — Telephone Encounter (Addendum)
Pt needed the Rx sent to the [facility] on [address] not [address]. Please re route.

## 2023-04-02 NOTE — Telephone Encounter (Signed)
Rx sent 

## 2023-05-01 ENCOUNTER — Telehealth: Payer: Self-pay | Admitting: Medical

## 2023-05-01 NOTE — Telephone Encounter (Signed)
Pt also wondering if he can get some refills sent so he does not have to call every month.   Medication: gabapentin (NEURONTIN) 600 MG tablet  Has the patient contacted their pharmacy? Yes.     Preferred Pharmacy:  [facility] - [place], [place] - [address] [facility] [place] [address] Phone: [phone]  Fax: [phone]

## 2023-05-02 NOTE — Telephone Encounter (Signed)
Called no answer vm full 

## 2023-05-26 ENCOUNTER — Ambulatory Visit: Payer: MEDICAID | Admitting: Medical

## 2023-05-29 ENCOUNTER — Other Ambulatory Visit: Payer: Self-pay | Admitting: Medical

## 2023-05-29 ENCOUNTER — Telehealth: Payer: Self-pay

## 2023-05-29 MED ORDER — GABAPENTIN 600 MG PO TABS: 600.0000 mg | ORAL_TABLET | Freq: Three times a day (TID) | ORAL | 0 refills | Status: DC

## 2023-05-29 NOTE — Telephone Encounter (Signed)
 Copied from CRM [id number]. Topic: Clinical - Medication Refill >> May 29, 2023  9:43 AM Rolin D wrote: Most Recent Primary Care Visit:  Provider: DORINA LOVING  Department: [facility]  Visit Type: NEW PATIENT  Date: 11/11/2022  Medication: gabapentin  (NEURONTIN ) 600 MG tablet  Has the patient contacted their pharmacy? No (Agent: If no, request that the patient contact the pharmacy for the refill. If patient does not wish to contact the pharmacy document the reason why and proceed with request.) (Agent: If yes, when and what did the pharmacy advise?)  Is this the correct pharmacy for this prescription? Yes If no, delete pharmacy and type the correct one.  This is the patient's preferred pharmacy:    [facility] - [place], [place] - [address] [place] [address] Phone: [phone] Fax: [phone]     Has the prescription been filled recently? No  Is the patient out of the medication? Yes  Has the patient been seen for an appointment in the last year OR does the patient have an upcoming appointment? Yes  Can we respond through MyChart? No  Agent: Please be advised that Rx refills may take up to 3 business days. We ask that you follow-up with your pharmacy.

## 2023-05-29 NOTE — Telephone Encounter (Signed)
 Pt called and a male answered stated he isnt home, asked her to have him call back to keep his appt and notify him that his medication has been sent

## 2023-05-29 NOTE — Telephone Encounter (Signed)
 Copied from CRM [id number]. Topic: Clinical - Prescription Issue >> May 29, 2023 10:26 AM Maisie BROCKS wrote: Reason for CRM: patient called and stated that he was needing a refill for gabapentin , when I checked the encounters, I read the note that edward put on the dec 5th stating that he needed an appt to receive his medications. Per the patient, he still didn't have the refills sent and stated that no one contacted him with this information regarding that he needed a follow-up appt, even though Edison documented that she did. When I checked his past appts, it showed the appt on 12/30 as sch and not completed. Therefore, I scheduled him for Monday per Edward's note. I reviewed his chart again and before I could notify the patient that the refills were sent In today to his correct pharmacy, He hung up the phone out of frustration that he still did not have his meds. Please call and advise pt if he still needs to complete his appt for Monday.

## 2023-05-30 ENCOUNTER — Ambulatory Visit: Payer: Self-pay | Admitting: Medical

## 2023-05-30 ENCOUNTER — Telehealth: Payer: Self-pay | Admitting: Emergency Medicine

## 2023-05-30 MED ORDER — GABAPENTIN 600 MG PO TABS: 600.0000 mg | ORAL_TABLET | Freq: Three times a day (TID) | ORAL | 0 refills | Status: DC

## 2023-05-30 NOTE — Telephone Encounter (Signed)
 Copied from CRM [id number]. Topic: Clinical - Prescription Issue >> May 30, 2023 10:06 AM Corin V wrote: Reason for CRM: Patient called in stating his Rx for gababpentin was sent to the wrong pharmacy. He stats it needed to go to [facility] on [address] in [place]. The Rx needs to be sent to them by 11AM so they can deliver it to him as they are closed on the weekend.

## 2023-05-30 NOTE — Telephone Encounter (Signed)
 Rx sent

## 2023-05-30 NOTE — Telephone Encounter (Signed)
 The patient called to confirm which pharmacy his gabapentin  prescription was sent.  Confirmed that [facility]ed to [facility] on [address].  The prescription was faxed and confirmed by the pharmacy per CMA Chiquita Sayres.  The patient verbalized understanding.  Reason for Disposition  [1] Follow-up call to recent contact AND [2] information only call, no triage required  Protocols used: Information Only Call - No Triage-A-AH

## 2023-06-02 ENCOUNTER — Ambulatory Visit: Payer: MEDICAID | Admitting: Medical

## 2023-06-16 ENCOUNTER — Ambulatory Visit: Payer: MEDICAID | Admitting: Physician Assistant

## 2023-06-16 ENCOUNTER — Encounter: Payer: Self-pay | Admitting: Physician Assistant

## 2023-06-16 VITALS — BP 112/65 | HR 66 | Ht 67.0 in | Wt 165.0 lb

## 2023-06-16 DIAGNOSIS — R768 Other specified abnormal immunological findings in serum: Secondary | ICD-10-CM

## 2023-06-16 DIAGNOSIS — G8929 Other chronic pain: Secondary | ICD-10-CM

## 2023-06-16 DIAGNOSIS — F1410 Cocaine abuse, uncomplicated: Secondary | ICD-10-CM | POA: Diagnosis not present

## 2023-06-16 DIAGNOSIS — M5441 Lumbago with sciatica, right side: Secondary | ICD-10-CM | POA: Diagnosis not present

## 2023-06-16 MED ORDER — GABAPENTIN 600 MG PO TABS: 600.0000 mg | ORAL_TABLET | Freq: Three times a day (TID) | ORAL | 1 refills | Status: AC

## 2023-06-16 NOTE — Progress Notes (Unsigned)
Established Patient Office Visit  Subjective   Patient ID: John Hood, male    DOB: 1980/07/14  Age: 43 y.o. MRN: [id number]  Chief Complaint  Patient presents with   Medical Clearance    After care services paperwork    Medication Dose Change    Requesting dosage increase for Gabapentin, and suboxone     Discussed the use of AI scribe software for clinical note transcription with the patient, who gave verbal consent to proceed.  History of Present Illness         The patient, with a history of substance abuse, has been in treatment at [facility] for the past two weeks and is due to transition to another treatment center in 14 days. He expresses uncertainty about committing to a full year at the new center due to familial obligations and concerns about the center's financial policies. He recently experienced a relapse with cocaine, which was brief and self-terminated. He reported this incident to his spouse and did not continue using.  The patient is currently on Suboxone, taking three 8mg  strips per day, and is concerned about the new center's policy of only providing one strip per day. He is considering requesting a higher dosage strip to split throughout the day to manage his cravings and prevent withdrawal symptoms.  The patient also takes gabapentin for chromic low back pain with sciatica.  States that his pain is well managed with this dose ,  He has a history of Hepatitis C and Hepatitis A, However, the patient has not received any treatment for these conditions.    Past Medical History:  Diagnosis Date   Back pain    Substance abuse (HCC)    Social History   Socioeconomic History   Marital status: Married    Spouse name: Not on file   Number of children: Not on file   Years of education: Not on file   Highest education level: Not on file  Occupational History   Not on file  Tobacco Use   Smoking status: Former    Current packs/day: 1.00    Average packs/day: 1  pack/day for 14.0 years (14.0 ttl pk-yrs)    Types: Cigarettes   Smokeless tobacco: Never  Vaping Use   Vaping status: Never Used  Substance and Sexual Activity   Alcohol use: Not Currently    Comment: daily   Drug use: Not Currently    Comment: xanax and heroin   Sexual activity: Yes  Other Topics Concern   Not on file  Social History Narrative   Not on file   Social Drivers of Health   Financial Resource Strain: Medium Risk (03/19/2018)   [facility] prior to 07/27/2022., [facility] visits prior to 07/27/2022.   Overall Financial Resource Strain (CARDIA)    Difficulty of Paying Living Expenses: Somewhat hard  Food Insecurity: Food Insecurity Present (03/19/2018)   Received from [facility] visits prior to 07/27/2022., [facility] visits prior to 07/27/2022.   Hunger Vital Sign    Worried About Running Out of Food in the Last Year: Sometimes true    Ran Out of Food in the Last Year: Sometimes true  Transportation Needs: No Transportation Needs (03/19/2018)   [facility] from [facility] visits prior to 07/27/2022., [facility] visits prior to 07/27/2022.   PRAPARE - Administrator, Civil Service (Medical): No  Lack of Transportation (Non-Medical): No  Physical Activity: Not on file  Stress: Not on file  Social Connections: Unknown (10/03/2021)   Received from [facility], [facility]   Social Network    Social Network: Not on file  Intimate Partner Violence: Unknown (08/29/2021)   Received from [facility], [facility]   HITS    Physically Hurt: Not on file    Insult or Talk Down To: Not on file    Threaten Physical Harm: Not on file    Scream or Curse: Not on file   Family History  Problem Relation Age of Onset   Cancer Mother    Allergies  Allergen Reactions   Aspirin Hives   Penicillin G Rash    Review  of Systems  Constitutional: Negative.   HENT: Negative.    Eyes: Negative.   Respiratory:  Negative for shortness of breath.   Cardiovascular:  Negative for chest pain.  Gastrointestinal: Negative.   Genitourinary: Negative.   Musculoskeletal: Negative.   Skin: Negative.   Neurological: Negative.   Endo/Heme/Allergies: Negative.   Psychiatric/Behavioral: Negative.        Objective:     BP 112/65 (BP Location: Left Arm, Patient Position: Sitting, Cuff Size: Large)   Pulse 66   Ht 5\' 7"  (1.702 m)   Wt 165 lb (74.8 kg)   SpO2 98%   BMI 25.84 kg/m  BP Readings from Last 3 Encounters:  06/16/23 112/65  11/11/22 108/60  10/14/22 109/68   Wt Readings from Last 3 Encounters:  06/16/23 165 lb (74.8 kg)  11/11/22 171 lb (77.6 kg)  10/14/22 174 lb (78.9 kg)    Physical Exam Vitals and nursing note reviewed.  Constitutional:      Appearance: Normal appearance.  HENT:     Head: Normocephalic and atraumatic.     Right Ear: External ear normal.     Left Ear: External ear normal.     Nose: Nose normal.     Mouth/Throat:     Mouth: Mucous membranes are moist.     Pharynx: Oropharynx is clear.  Eyes:     Extraocular Movements: Extraocular movements intact.     Conjunctiva/sclera: Conjunctivae normal.     Pupils: Pupils are equal, round, and reactive to light.  Cardiovascular:     Rate and Rhythm: Normal rate and regular rhythm.     Pulses: Normal pulses.     Heart sounds: Normal heart sounds.  Pulmonary:     Effort: Pulmonary effort is normal.     Breath sounds: Normal breath sounds.  Musculoskeletal:        General: Normal range of motion.     Cervical back: Normal range of motion and neck supple.  Skin:    General: Skin is warm and dry.  Neurological:     General: No focal deficit present.     Mental Status: He is alert and oriented to person, place, and time.  Psychiatric:        Mood and Affect: Mood normal.        Behavior: Behavior normal.        Thought  Content: Thought content normal.        Judgment: Judgment normal.        Assessment & Plan:   Problem List Items Addressed This Visit       Nervous and Auditory   Chronic right-sided low back pain with right-sided sciatica - Primary   Relevant Medications   gabapentin (NEURONTIN) 600 MG  tablet   Other Relevant Orders   Comp. Metabolic Panel (12)   CBC with Differential/Platelet   Other Visit Diagnoses       Cocaine abuse (HCC)         Hepatitis C antibody test positive       Relevant Orders   HCV Ab w Reflex to Quant PCR     Hepatitis A antibody positive       Relevant Orders   Hepatitis A Antibody, Total       1. Chronic right-sided low back pain with right-sided sciatica (Primary) Continue current regimen.  Patient education given on supportive care - Comp. Metabolic Panel (12) - CBC with Differential/Platelet - gabapentin (NEURONTIN) 600 MG tablet; Take 1 tablet (600 mg total) by mouth 3 (three) times daily.  Dispense: 270 tablet; Refill: 1  2. Cocaine abuse (HCC) Currently in substance abuse treatment program.  Encouraged patient to continue follow-up with psychiatry to discuss change in dosing of Suboxone, follow-up long-term care  3. Hepatitis C antibody test positive Refer to infectious disease if needed based on lab results - HCV Ab w Reflex to Quant PCR  4. Hepatitis A antibody positive Refer to infectious disease based on lab results - Hepatitis A Antibody, Total    I have reviewed the patient's medical history (PMH, PSH, Social History, Family History, Medications, and allergies) , and have been updated if relevant. I spent 30 minutes reviewing chart and  face to face time with patient.     Return if symptoms worsen or fail to improve.    Kasandra Knudsen Mayers, PA-C

## 2023-06-16 NOTE — Patient Instructions (Signed)
We will call with today's lab results.  Please let us know if there is anything else we can do for you.  Roney Jaffe, PA-C Physician Assistant [facility] [email or web address]  Health Maintenance, Male Adopting a healthy lifestyle and getting preventive care are important in promoting health and wellness. Ask your health care provider about: The right schedule for you to have regular tests and exams. Things you can do on your own to prevent diseases and keep yourself healthy. What should I know about diet, weight, and exercise? Eat a healthy diet  Eat a diet that includes plenty of vegetables, fruits, low-fat dairy products, and lean protein. Do not eat a lot of foods that are high in solid fats, added sugars, or sodium. Maintain a healthy weight Body mass index (BMI) is a measurement that can be used to identify possible weight problems. It estimates body fat based on height and weight. Your health care provider can help determine your BMI and help you achieve or maintain a healthy weight. Get regular exercise Get regular exercise. This is one of the most important things you can do for your health. Most adults should: Exercise for at least 150 minutes each week. The exercise should increase your heart rate and make you sweat (moderate-intensity exercise). Do strengthening exercises at least twice a week. This is in addition to the moderate-intensity exercise. Spend less time sitting. Even light physical activity can be beneficial. Watch cholesterol and blood lipids Have your blood tested for lipids and cholesterol at 41 years of age, then have this test every 5 years. You may need to have your cholesterol levels checked more often if: Your lipid or cholesterol levels are high. You are older than 41 years of age. You are at high risk for heart disease. What should I know about cancer screening? Many types of cancers can be  detected early and may often be prevented. Depending on your health history and family history, you may need to have cancer screening at various ages. This may include screening for: Colorectal cancer. Prostate cancer. Skin cancer. Lung cancer. What should I know about heart disease, diabetes, and high blood pressure? Blood pressure and heart disease High blood pressure causes heart disease and increases the risk of stroke. This is more likely to develop in people who have high blood pressure readings or are overweight. Talk with your health care provider about your target blood pressure readings. Have your blood pressure checked: Every 3-5 years if you are 27-15 years of age. Every year if you are 73 years old or older. If you are between the ages of 39 and 39 and are a current or former smoker, ask your health care provider if you should have a one-time screening for abdominal aortic aneurysm (AAA). Diabetes Have regular diabetes screenings. This checks your fasting blood sugar level. Have the screening done: Once every three years after age 84 if you are at a normal weight and have a low risk for diabetes. More often and at a younger age if you are overweight or have a high risk for diabetes. What should I know about preventing infection? Hepatitis B If you have a higher risk for hepatitis B, you should be screened for this virus. Talk with your health care provider to find out if you are at risk for hepatitis B infection. Hepatitis C Blood testing is recommended for: Everyone born from [birth year] through 1963. Anyone with known risk factors for hepatitis C. Sexually transmitted infections (STIs)  You should be screened each year for STIs, including gonorrhea and chlamydia, if: You are sexually active and are younger than 41 years of age. You are older than 41 years of age and your health care provider tells you that you are at risk for this type of infection. Your sexual activity has changed  since you were last screened, and you are at increased risk for chlamydia or gonorrhea. Ask your health care provider if you are at risk. Ask your health care provider about whether you are at high risk for HIV. Your health care provider may recommend a prescription medicine to help prevent HIV infection. If you choose to take medicine to prevent HIV, you should first get tested for HIV. You should then be tested every 3 months for as long as you are taking the medicine. Follow these instructions at home: Alcohol use Do not drink alcohol if your health care provider tells you not to drink. If you drink alcohol: Limit how much you have to 0-2 drinks a day. Know how much alcohol is in your drink. In the [place] one drink equals one 12 oz bottle of beer (355 mL), one 5 oz glass of wine (148 mL), or one 1 oz glass of hard liquor (44 mL). Lifestyle Do not use any products that contain nicotine or tobacco. These products include cigarettes, chewing tobacco, and vaping devices, such as e-cigarettes. If you need help quitting, ask your health care provider. Do not use street drugs. Do not share needles. Ask your health care provider for help if you need support or information about quitting drugs. General instructions Schedule regular health, dental, and eye exams. Stay current with your vaccines. Tell your health care provider if: You often feel depressed. You have ever been abused or do not feel safe at home. Summary Adopting a healthy lifestyle and getting preventive care are important in promoting health and wellness. Follow your health care provider's instructions about healthy diet, exercising, and getting tested or screened for diseases. Follow your health care provider's instructions on monitoring your cholesterol and blood pressure. This information is not intended to replace advice given to you by your health care provider. Make sure you discuss any questions you have with your health care  provider. Document Revised: 10/02/2020 Document Reviewed: 10/02/2020 Elsevier Patient Education  2024 [employer].

## 2023-06-17 ENCOUNTER — Encounter: Payer: Self-pay | Admitting: Physician Assistant

## 2023-06-17 DIAGNOSIS — F1410 Cocaine abuse, uncomplicated: Secondary | ICD-10-CM | POA: Insufficient documentation

## 2023-06-17 DIAGNOSIS — R768 Other specified abnormal immunological findings in serum: Secondary | ICD-10-CM | POA: Insufficient documentation

## 2023-06-18 LAB — COMP. METABOLIC PANEL (12)
AST: 24 [IU]/L (ref 0–40)
Albumin: 4.6 g/dL (ref 4.1–5.1)
Alkaline Phosphatase: 68 [IU]/L (ref 44–121)
BUN/Creatinine Ratio: 12 (ref 9–20)
BUN: 12 mg/dL (ref 6–24)
Bilirubin Total: <0.2 mg/dL (ref 0.0–1.2)
Calcium: 9.7 mg/dL (ref 8.7–10.2)
Chloride: 102 mmol/L (ref 96–106)
Creatinine, Ser: 0.98 mg/dL (ref 0.76–1.27)
Globulin, Total: 2.4 g/dL (ref 1.5–4.5)
Glucose: 84 mg/dL (ref 70–99)
Potassium: 4.3 mmol/L (ref 3.5–5.2)
Sodium: 141 mmol/L (ref 134–144)
Total Protein: 7 g/dL (ref 6.0–8.5)
eGFR: 100 mL/min/{1.73_m2} (ref >59)

## 2023-06-18 LAB — CBC WITH DIFFERENTIAL/PLATELET
Basophils Absolute: 0 10*3/uL (ref 0.0–0.2)
Basos: 0 %
EOS (ABSOLUTE): 0 10*3/uL (ref 0.0–0.4)
Eos: 0 %
Hematocrit: 42.9 % (ref 37.5–51.0)
Hemoglobin: 14.2 g/dL (ref 13.0–17.7)
Immature Grans (Abs): 0.1 10*3/uL (ref 0.0–0.1)
Immature Granulocytes: 1 %
Lymphocytes Absolute: 2.3 10*3/uL (ref 0.7–3.1)
Lymphs: 23 %
MCH: 29.9 pg (ref 26.6–33.0)
MCHC: 33.1 g/dL (ref 31.5–35.7)
MCV: 90 fL (ref 79–97)
Monocytes Absolute: 0.6 10*3/uL (ref 0.1–0.9)
Monocytes: 6 %
Neutrophils Absolute: 7.1 10*3/uL — ABNORMAL HIGH (ref 1.4–7.0)
Neutrophils: 70 %
Platelets: 237 10*3/uL (ref 150–450)
RBC: 4.75 x10E6/uL (ref 4.14–5.80)
RDW: 12.9 % (ref 11.6–15.4)
WBC: 10 10*3/uL (ref 3.4–10.8)

## 2023-06-18 LAB — HCV AB W REFLEX TO QUANT PCR: HCV Ab: REACTIVE — AB

## 2023-06-18 LAB — HEPATITIS A ANTIBODY, TOTAL: hep A Total Ab: POSITIVE — AB

## 2023-06-18 LAB — HCV RT-PCR, QUANT (NON-GRAPH): Hepatitis C Quantitation: NOT DETECTED [IU]/mL
